# Patient Record
Sex: Female | Born: 1935 | Race: White | Hispanic: No | Smoking: Never smoker
Health system: Southern US, Community
[De-identification: ages and names within clinical notes are randomized; demographics above are authoritative.]

## PROBLEM LIST (undated history)

## (undated) DIAGNOSIS — R413 Other amnesia: Secondary | ICD-10-CM

## (undated) DIAGNOSIS — E559 Vitamin D deficiency, unspecified: Secondary | ICD-10-CM

## (undated) HISTORY — DX: Vitamin D deficiency, unspecified: E55.9

---

## 2014-01-17 ENCOUNTER — Other Ambulatory Visit: Payer: Self-pay | Admitting: Family Medicine

## 2014-01-17 DIAGNOSIS — N950 Postmenopausal bleeding: Secondary | ICD-10-CM

## 2014-01-30 ENCOUNTER — Other Ambulatory Visit: Payer: Self-pay

## 2014-02-12 ENCOUNTER — Other Ambulatory Visit: Payer: Self-pay | Admitting: Obstetrics and Gynecology

## 2014-02-12 ENCOUNTER — Other Ambulatory Visit (HOSPITAL_COMMUNITY)
Admission: RE | Admit: 2014-02-12 | Discharge: 2014-02-12 | Disposition: A | Discharge status: Home / Self Care | Payer: Medicare Other | Source: Ambulatory Visit | Attending: Obstetrics and Gynecology | Admitting: Obstetrics and Gynecology

## 2014-02-12 DIAGNOSIS — Z1151 Encounter for screening for human papillomavirus (HPV): Secondary | ICD-10-CM | POA: Insufficient documentation

## 2014-02-12 DIAGNOSIS — Z124 Encounter for screening for malignant neoplasm of cervix: Secondary | ICD-10-CM | POA: Insufficient documentation

## 2014-02-13 LAB — CYTOLOGY - PAP

## 2014-02-20 ENCOUNTER — Other Ambulatory Visit (HOSPITAL_COMMUNITY)
Admission: RE | Admit: 2014-02-20 | Discharge: 2014-02-20 | Disposition: A | Discharge status: Home / Self Care | Payer: Medicare Other | Source: Ambulatory Visit | Attending: Obstetrics and Gynecology | Admitting: Obstetrics and Gynecology

## 2014-02-20 ENCOUNTER — Other Ambulatory Visit: Payer: Self-pay | Admitting: Obstetrics and Gynecology

## 2014-02-20 DIAGNOSIS — Z124 Encounter for screening for malignant neoplasm of cervix: Secondary | ICD-10-CM | POA: Insufficient documentation

## 2014-02-21 LAB — CYTOLOGY - PAP

## 2015-05-06 ENCOUNTER — Ambulatory Visit
Admission: RE | Admit: 2015-05-06 | Discharge: 2015-05-06 | Disposition: A | Discharge status: Home / Self Care | Payer: Medicare Other | Source: Ambulatory Visit | Attending: Family Medicine | Admitting: Family Medicine

## 2015-05-06 ENCOUNTER — Other Ambulatory Visit: Payer: Self-pay | Admitting: Family Medicine

## 2015-05-06 DIAGNOSIS — W19XXXA Unspecified fall, initial encounter: Secondary | ICD-10-CM

## 2017-09-05 ENCOUNTER — Emergency Department (HOSPITAL_COMMUNITY): Payer: Medicare Other

## 2017-09-05 ENCOUNTER — Emergency Department (HOSPITAL_COMMUNITY)
Admission: EM | Admit: 2017-09-05 | Discharge: 2017-09-05 | Disposition: A | Discharge status: Home / Self Care | Payer: Medicare Other | Attending: Emergency Medicine | Admitting: Emergency Medicine

## 2017-09-05 ENCOUNTER — Encounter (HOSPITAL_COMMUNITY): Payer: Self-pay | Admitting: Emergency Medicine

## 2017-09-05 DIAGNOSIS — F0390 Unspecified dementia without behavioral disturbance: Secondary | ICD-10-CM | POA: Insufficient documentation

## 2017-09-05 DIAGNOSIS — R1110 Vomiting, unspecified: Secondary | ICD-10-CM | POA: Insufficient documentation

## 2017-09-05 DIAGNOSIS — R05 Cough: Secondary | ICD-10-CM | POA: Diagnosis present

## 2017-09-05 DIAGNOSIS — J189 Pneumonia, unspecified organism: Secondary | ICD-10-CM | POA: Diagnosis not present

## 2017-09-05 HISTORY — DX: Other amnesia: R41.3

## 2017-09-05 LAB — URINALYSIS, ROUTINE W REFLEX MICROSCOPIC
Bilirubin Urine: NEGATIVE
GLUCOSE, UA: NEGATIVE mg/dL
HGB URINE DIPSTICK: NEGATIVE
KETONES UR: NEGATIVE mg/dL
LEUKOCYTES UA: NEGATIVE
NITRITE: NEGATIVE
PH: 7 (ref 5.0–8.0)
PROTEIN: 30 mg/dL — AB
Specific Gravity, Urine: 1.012 (ref 1.005–1.030)

## 2017-09-05 LAB — COMPREHENSIVE METABOLIC PANEL
ALK PHOS: 115 U/L (ref 38–126)
ALT: 17 U/L (ref 14–54)
ANION GAP: 7 (ref 5–15)
AST: 24 U/L (ref 15–41)
Albumin: 3.8 g/dL (ref 3.5–5.0)
BUN: 17 mg/dL (ref 6–20)
CALCIUM: 9.4 mg/dL (ref 8.9–10.3)
CO2: 27 mmol/L (ref 22–32)
Chloride: 104 mmol/L (ref 101–111)
Creatinine, Ser: 0.77 mg/dL (ref 0.44–1.00)
Glucose, Bld: 168 mg/dL — ABNORMAL HIGH (ref 65–99)
Potassium: 3.9 mmol/L (ref 3.5–5.1)
SODIUM: 138 mmol/L (ref 135–145)
TOTAL PROTEIN: 8 g/dL (ref 6.5–8.1)
Total Bilirubin: 0.2 mg/dL — ABNORMAL LOW (ref 0.3–1.2)

## 2017-09-05 LAB — CBC
HCT: 37.5 % (ref 36.0–46.0)
HEMOGLOBIN: 11.9 g/dL — AB (ref 12.0–15.0)
MCH: 28.2 pg (ref 26.0–34.0)
MCHC: 31.7 g/dL (ref 30.0–36.0)
MCV: 88.9 fL (ref 78.0–100.0)
Platelets: 267 10*3/uL (ref 150–400)
RBC: 4.22 MIL/uL (ref 3.87–5.11)
RDW: 13.3 % (ref 11.5–15.5)
WBC: 8.3 10*3/uL (ref 4.0–10.5)

## 2017-09-05 LAB — LIPASE, BLOOD: Lipase: 36 U/L (ref 11–51)

## 2017-09-05 MED ORDER — AZITHROMYCIN 250 MG PO TABS: 250.0000 mg | ORAL_TABLET | Freq: Every day | ORAL | 0 refills | Status: DC

## 2017-09-05 MED ORDER — IOPAMIDOL (ISOVUE-300) INJECTION 61%
INTRAVENOUS | Status: AC
  Filled 2017-09-05: qty 75

## 2017-09-05 MED ORDER — DEXTROSE 5 % IV SOLN
1.0000 g | Freq: Once | INTRAVENOUS | Status: AC
  Administered 2017-09-05: 1 g via INTRAVENOUS
  Filled 2017-09-05: qty 10

## 2017-09-05 MED ORDER — AZITHROMYCIN 500 MG IV SOLR
500.0000 mg | Freq: Once | INTRAVENOUS | Status: AC
  Administered 2017-09-05: 500 mg via INTRAVENOUS
  Filled 2017-09-05: qty 500

## 2017-09-05 MED ORDER — ONDANSETRON HCL 4 MG/2ML IJ SOLN
4.0000 mg | Freq: Once | INTRAMUSCULAR | Status: AC
  Administered 2017-09-05: 4 mg via INTRAVENOUS
  Filled 2017-09-05: qty 2

## 2017-09-05 MED ORDER — SODIUM CHLORIDE 0.9 % IV BOLUS (SEPSIS)
500.0000 mL | Freq: Once | INTRAVENOUS | Status: AC
  Administered 2017-09-05: 500 mL via INTRAVENOUS

## 2017-09-05 MED ORDER — IOPAMIDOL (ISOVUE-300) INJECTION 61%
75.0000 mL | Freq: Once | INTRAVENOUS | Status: AC | PRN
  Administered 2017-09-05: 75 mL via INTRAVENOUS

## 2017-09-05 NOTE — Discharge Instructions (Signed)
See your Physician for recheck in 3-4 day.  You need a repeat a Ct scan of your chest in 4 weeks to evaluate for nodule

## 2017-09-05 NOTE — ED Triage Notes (Signed)
Patients son reports pt c/o nausea and "not feeling right." symptoms began 10am this morning. Pt presented covered in emesis in lap. Denies any abdominal pain.

## 2017-09-05 NOTE — ED Provider Notes (Signed)
[facility]-EMERGENCY DEPT Provider Note   CSN: [id number] Arrival date & time: 09/05/17  1219     History   Chief Complaint Chief Complaint  Patient presents with  . Emesis  . Hypertension    HPI Samantha Hickman is a 83 y.o. female.  The history is provided by a relative. No language interpreter was used.  Emesis   This is a new problem. The current episode started 1 to 2 hours ago. The problem occurs 2 to 4 times per day. The problem has not changed since onset.There has been no fever. Pertinent negatives include no abdominal pain, no chills and no fever.  Hypertension  Pertinent negatives include no abdominal pain.  Family reports pt has had a cough.  Pt recently started on aricept for dementia.  No other medications.  No history of hypertension.  Pt was a church an vomited today.  Family reports pt has had a recent increase in dementia symptoms.  Pt lives at home.    Past Medical History:  Diagnosis Date  . Memory impairment     There are no active problems to display for this patient.   History reviewed. No pertinent surgical history.  OB History    No data available       Home Medications    Prior to Admission medications   Medication Sig Start Date End Date Taking? Authorizing Provider  donepezil (ARICEPT) 10 MG tablet Take 10 mg by mouth at bedtime.   Yes [provider]  azithromycin (ZITHROMAX) 250 MG tablet Take 1 tablet (250 mg total) by mouth daily. Take first 2 tablets together, then 1 every day until finished. 09/05/17   Elson Areas, PA-C    Family History No family history on file.  Social History Social History   Tobacco Use  . Smoking status: Never Smoker  . Smokeless tobacco: Never Used  Substance Use Topics  . Alcohol use: No    Frequency: Never  . Drug use: Not on file     Allergies   Patient has no known allergies.   Review of Systems Review of Systems  Constitutional: Negative for chills and  fever.  Gastrointestinal: Positive for vomiting. Negative for abdominal pain.  All other systems reviewed and are negative.    Physical Exam Updated Vital Signs BP (!) 150/80 (BP Location: Right Arm)   Pulse 88   Temp (!) 97.4 F (36.3 C) (Oral)   Resp 16   SpO2 94%   Physical Exam  Constitutional: She is oriented to person, place, and time. She appears well-developed and well-nourished.  HENT:  Head: Normocephalic.  Right Ear: External ear normal.  Left Ear: External ear normal.  Eyes: Conjunctivae and EOM are normal. Pupils are equal, round, and reactive to light.  Neck: Normal range of motion.  Cardiovascular: Normal rate and regular rhythm.  Pulmonary/Chest: Effort normal.  Abdominal: She exhibits no distension.  Musculoskeletal: Normal range of motion.  Neurological: She is alert and oriented to person, place, and time.  Skin: Skin is warm.  Psychiatric: She has a normal mood and affect.  Nursing note and vitals reviewed.    ED Treatments / Results  Labs (all labs ordered are listed, but only abnormal results are displayed) Labs Reviewed  COMPREHENSIVE METABOLIC PANEL - Abnormal; Notable for the following components:      Result Value   Glucose, Bld 168 (*)    Total Bilirubin 0.2 (*)    All other components within normal limits  CBC - Abnormal; Notable for the following components:   Hemoglobin 11.9 (*)    All other components within normal limits  URINALYSIS, ROUTINE W REFLEX MICROSCOPIC - Abnormal; Notable for the following components:   Color, Urine STRAW (*)    Protein, ur 30 (*)    Bacteria, UA FEW (*)    Squamous Epithelial / LPF 0-5 (*)    All other components within normal limits  LIPASE, BLOOD    EKG  EKG Interpretation None       Radiology Dg Chest 2 View  Result Date: 09/05/2017 CLINICAL DATA:  Cough and congestion EXAM: CHEST  2 VIEW COMPARISON:  None. FINDINGS: Cardiac shadow is within normal limits. The lungs are well aerated  bilaterally. Nodular changes are seen bilaterally suspicious for metastatic disease. Diffuse interstitial changes are noted. No sizable effusion is noted. Bridging osteophytes are noted within the thoracic spine. IMPRESSION: Nodular appearing densities bilaterally suspicious for metastatic disease. CT of the chest would be helpful for further evaluation. Electronically Signed   By: Alcide CleverMark  Lukens M.D.   On: 09/05/2017 13:58   Ct Head Wo Contrast  Result Date: 09/05/2017 CLINICAL DATA:  Altered level of consciousness.  Nausea.  Vomiting. EXAM: CT HEAD WITHOUT CONTRAST TECHNIQUE: Contiguous axial images were obtained from the base of the skull through the vertex without intravenous contrast. COMPARISON:  None. FINDINGS: Brain: No evidence of parenchymal hemorrhage or extra-axial fluid collection. No mass lesion, mass effect, or midline shift. No CT evidence of acute infarction. Nonspecific mild to moderate subcortical and periventricular white matter hypodensity, most in keeping with chronic small vessel ischemic change. Generalized cerebral volume loss. Cerebral ventricle sizes are concordant with the degree of cerebral volume loss. Vascular: No acute abnormality. Skull: No evidence of calvarial fracture. Sinuses/Orbits: No fluid levels. Mucoperiosteal thickening in the maxillary sinuses, ethmoidal air cells, frontal sinus and sphenoid sinus. Other:  The mastoid air cells are unopacified. IMPRESSION: 1.  No evidence of acute intracranial abnormality. 2. Generalized cerebral volume loss and mild-to-moderate chronic small vessel ischemic changes in the cerebral white matter. 3. Mild paranasal sinusitis, chronic appearing. Electronically Signed   By: Delbert PhenixJason A Poff M.D.   On: 09/05/2017 14:24   Ct Chest W Contrast  Result Date: 09/05/2017 CLINICAL DATA:  Pulmonary nodule on chest radiograph. Clinical history of cough and congestion. No primary history malignancy. EXAM: CT CHEST WITH CONTRAST TECHNIQUE:  Multidetector CT imaging of the chest was performed during intravenous contrast administration. CONTRAST:  75mL ISOVUE-300 IOPAMIDOL (ISOVUE-300) INJECTION 61% COMPARISON:  Chest radiograph of earlier today. FINDINGS: Cardiovascular: Aortic and branch vessel atherosclerosis. Tortuous thoracic aorta. Normal heart size, without pericardial effusion. Lad coronary artery atherosclerosis. No central pulmonary embolism, on this non-dedicated study. Mediastinum/Nodes: No mediastinal or hilar adenopathy. Lungs/Pleura: No pleural fluid. A relatively diffuse pattern of peribronchovascular nodularity, mucoid impaction, and bronchiectasis. More focal volume loss and traction bronchiectasis within the right middle lobe and lingula. Concurrent larger pulmonary nodules, including within the right lower lobe at 1.7 cm on image 101/series 5 and the left lower lobe at 2.0 cm on image 107/series 5. Right middle lobe cavitary nodule, including at 1.5 cm on image 72/ series 5. Upper Abdomen: Normal imaged portions of the liver, spleen, stomach, right adrenal gland, left kidney. Mild left adrenal thickening. Incompletely imaged upper pole right renal 3.3 cm lesion is most consistent with a cyst or minimally complex cyst. Musculoskeletal: Osteopenia. Accentuation of expected thoracic kyphosis with diffuse idiopathic skeletal hyperostosis. IMPRESSION: 1. Pattern of diffuse pulmonary  nodularity, mucoid impaction, and bronchiectasis. 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 2. Concurrent larger nodules which are indeterminate. These also could be infectious. One or more neoplastic nodules (primary or metastasis) cannot be excluded. Consider antibiotic therapy and relatively short term CT follow-up at 3-4 weeks. 3. Coronary artery atherosclerosis. Aortic Atherosclerosis (ICD10-I70.0). Electronically Signed   By: Jeronimo GreavesKyle  Talbot M.D.   On: 09/05/2017 15:29    Procedures Procedures  (including critical care time)  Medications Ordered in ED Medications  iopamidol (ISOVUE-300) 61 % injection (not administered)  azithromycin (ZITHROMAX) 500 mg in dextrose 5 % 250 mL IVPB (500 mg Intravenous New Bag/Given 09/05/17 1600)  sodium chloride 0.9 % bolus 500 mL (0 mLs Intravenous Stopped 09/05/17 1435)  ondansetron (ZOFRAN) injection 4 mg (4 mg Intravenous Given 09/05/17 1406)  iopamidol (ISOVUE-300) 61 % injection 75 mL (75 mLs Intravenous Contrast Given 09/05/17 1507)  cefTRIAXone (ROCEPHIN) 1 g in dextrose 5 % 50 mL IVPB (0 g Intravenous Stopped 09/05/17 1655)     Initial Impression / Assessment and Plan / ED Course  I have reviewed the triage vital signs and the nursing notes.  Pertinent labs & imaging results that were available during my care of the patient were reviewed by me and considered in my medical decision making (see chart for details).   PORT score 3.  Pt looks good overall.  No wbc count.  Symptoms seem consistent with possible early pneumonia.  I will cover with Rocephin and zithromax.  Family advised pt will need to return f symptoms worsen or change.    I advised Radiologist advised repeat ct scan in 4 weeks for evaluation of pulmonary nodules.     Final Clinical Impressions(s) / ED Diagnoses   Final diagnoses:  Community acquired pneumonia, unspecified laterality    ED Discharge Orders        Ordered    azithromycin (ZITHROMAX) 250 MG tablet  Daily     09/05/17 1643     Current Meds  Medication Sig  . donepezil (ARICEPT) 10 MG tablet Take 10 mg by mouth at bedtime.  An After Visit Summary was printed and given to the patient.     Elson AreasSofia, Sheli Dorin K, [place]PA-C 09/05/17 1706    Marily MemosMesner, Jason, MD 09/06/17 [phone]

## 2017-10-07 ENCOUNTER — Other Ambulatory Visit: Payer: Self-pay | Admitting: Nurse Practitioner

## 2017-10-07 DIAGNOSIS — R918 Other nonspecific abnormal finding of lung field: Secondary | ICD-10-CM

## 2017-10-07 DIAGNOSIS — J189 Pneumonia, unspecified organism: Secondary | ICD-10-CM

## 2017-10-13 ENCOUNTER — Ambulatory Visit
Admission: RE | Admit: 2017-10-13 | Discharge: 2017-10-13 | Disposition: A | Discharge status: Home / Self Care | Payer: Medicare Other | Source: Ambulatory Visit | Attending: Nurse Practitioner | Admitting: Nurse Practitioner

## 2017-10-13 DIAGNOSIS — J189 Pneumonia, unspecified organism: Secondary | ICD-10-CM

## 2017-10-13 DIAGNOSIS — R918 Other nonspecific abnormal finding of lung field: Secondary | ICD-10-CM

## 2017-10-13 MED ORDER — IOPAMIDOL (ISOVUE-300) INJECTION 61%
75.0000 mL | Freq: Once | INTRAVENOUS | Status: AC | PRN
  Administered 2017-10-13: 75 mL via INTRAVENOUS

## 2020-07-03 ENCOUNTER — Emergency Department (HOSPITAL_COMMUNITY): Payer: Medicare Other

## 2020-07-03 ENCOUNTER — Other Ambulatory Visit: Payer: Self-pay

## 2020-07-03 ENCOUNTER — Inpatient Hospital Stay (HOSPITAL_COMMUNITY)
Admission: EM | Admit: 2020-07-03 | Discharge: 2020-07-08 | DRG: 481 | Disposition: A | Discharge status: Skilled Nursing Facility | Payer: Medicare Other | Attending: Internal Medicine | Admitting: Internal Medicine

## 2020-07-03 ENCOUNTER — Emergency Department (HOSPITAL_COMMUNITY): Payer: Medicare Other | Admitting: Anesthesiology

## 2020-07-03 ENCOUNTER — Encounter (HOSPITAL_COMMUNITY)
Admission: EM | Disposition: A | Discharge status: Skilled Nursing Facility | Payer: Self-pay | Source: Home / Self Care | Attending: Internal Medicine

## 2020-07-03 ENCOUNTER — Inpatient Hospital Stay (HOSPITAL_COMMUNITY): Payer: Medicare Other

## 2020-07-03 ENCOUNTER — Encounter (HOSPITAL_COMMUNITY): Payer: Self-pay | Admitting: Emergency Medicine

## 2020-07-03 DIAGNOSIS — Z66 Do not resuscitate: Secondary | ICD-10-CM | POA: Diagnosis present

## 2020-07-03 DIAGNOSIS — D62 Acute posthemorrhagic anemia: Secondary | ICD-10-CM | POA: Diagnosis not present

## 2020-07-03 DIAGNOSIS — S72142A Displaced intertrochanteric fracture of left femur, initial encounter for closed fracture: Principal | ICD-10-CM | POA: Diagnosis present

## 2020-07-03 DIAGNOSIS — W1830XA Fall on same level, unspecified, initial encounter: Secondary | ICD-10-CM | POA: Diagnosis present

## 2020-07-03 DIAGNOSIS — Z20822 Contact with and (suspected) exposure to covid-19: Secondary | ICD-10-CM | POA: Diagnosis present

## 2020-07-03 DIAGNOSIS — Z419 Encounter for procedure for purposes other than remedying health state, unspecified: Secondary | ICD-10-CM

## 2020-07-03 DIAGNOSIS — A310 Pulmonary mycobacterial infection: Secondary | ICD-10-CM | POA: Diagnosis present

## 2020-07-03 DIAGNOSIS — Z79899 Other long term (current) drug therapy: Secondary | ICD-10-CM

## 2020-07-03 DIAGNOSIS — Y92009 Unspecified place in unspecified non-institutional (private) residence as the place of occurrence of the external cause: Secondary | ICD-10-CM

## 2020-07-03 DIAGNOSIS — F0390 Unspecified dementia without behavioral disturbance: Secondary | ICD-10-CM | POA: Diagnosis present

## 2020-07-03 DIAGNOSIS — W19XXXA Unspecified fall, initial encounter: Secondary | ICD-10-CM

## 2020-07-03 DIAGNOSIS — S72002A Fracture of unspecified part of neck of left femur, initial encounter for closed fracture: Secondary | ICD-10-CM

## 2020-07-03 DIAGNOSIS — E559 Vitamin D deficiency, unspecified: Secondary | ICD-10-CM | POA: Diagnosis present

## 2020-07-03 DIAGNOSIS — R600 Localized edema: Secondary | ICD-10-CM | POA: Diagnosis present

## 2020-07-03 DIAGNOSIS — T148XXA Other injury of unspecified body region, initial encounter: Secondary | ICD-10-CM

## 2020-07-03 HISTORY — PX: INTRAMEDULLARY (IM) NAIL INTERTROCHANTERIC: SHX5875

## 2020-07-03 HISTORY — PX: ORIF FEMUR FRACTURE: SHX2119

## 2020-07-03 LAB — CBC WITH DIFFERENTIAL/PLATELET
Abs Immature Granulocytes: 0.03 10*3/uL (ref 0.00–0.07)
Basophils Absolute: 0.1 10*3/uL (ref 0.0–0.1)
Basophils Relative: 1 %
Eosinophils Absolute: 0.2 10*3/uL (ref 0.0–0.5)
Eosinophils Relative: 2 %
HCT: 42.2 % (ref 36.0–46.0)
Hemoglobin: 12.7 g/dL (ref 12.0–15.0)
Immature Granulocytes: 0 %
Lymphocytes Relative: 12 %
Lymphs Abs: 1 10*3/uL (ref 0.7–4.0)
MCH: 27.4 pg (ref 26.0–34.0)
MCHC: 30.1 g/dL (ref 30.0–36.0)
MCV: 90.9 fL (ref 80.0–100.0)
Monocytes Absolute: 0.4 10*3/uL (ref 0.1–1.0)
Monocytes Relative: 4 %
Neutro Abs: 7.1 10*3/uL (ref 1.7–7.7)
Neutrophils Relative %: 81 %
Platelets: 234 10*3/uL (ref 150–400)
RBC: 4.64 MIL/uL (ref 3.87–5.11)
RDW: 13.2 % (ref 11.5–15.5)
WBC: 8.7 10*3/uL (ref 4.0–10.5)
nRBC: 0 % (ref 0.0–0.2)

## 2020-07-03 LAB — BASIC METABOLIC PANEL
Anion gap: 7 (ref 5–15)
BUN: 14 mg/dL (ref 8–23)
CO2: 27 mmol/L (ref 22–32)
Calcium: 9 mg/dL (ref 8.9–10.3)
Chloride: 103 mmol/L (ref 98–111)
Creatinine, Ser: 0.86 mg/dL (ref 0.44–1.00)
GFR, Estimated: >60 mL/min (ref >60)
Glucose, Bld: 141 mg/dL — ABNORMAL HIGH (ref 70–99)
Potassium: 4.4 mmol/L (ref 3.5–5.1)
Sodium: 137 mmol/L (ref 135–145)

## 2020-07-03 LAB — RESPIRATORY PANEL BY RT PCR (FLU A&B, COVID)
Influenza A by PCR: NEGATIVE
Influenza B by PCR: NEGATIVE
SARS Coronavirus 2 by RT PCR: NEGATIVE

## 2020-07-03 SURGERY — FIXATION, FRACTURE, INTERTROCHANTERIC, WITH INTRAMEDULLARY ROD
Anesthesia: Monitor Anesthesia Care | Laterality: Left

## 2020-07-03 MED ORDER — CEFAZOLIN SODIUM-DEXTROSE 2-4 GM/100ML-% IV SOLN
2.0000 g | Freq: Three times a day (TID) | INTRAVENOUS | Status: AC
  Administered 2020-07-04 (×3): 2 g via INTRAVENOUS
  Filled 2020-07-03 (×3): qty 100

## 2020-07-03 MED ORDER — FENTANYL CITRATE (PF) 100 MCG/2ML IJ SOLN: 25.0000 ug | INTRAMUSCULAR | Status: DC | PRN

## 2020-07-03 MED ORDER — ONDANSETRON HCL 4 MG PO TABS: 4.0000 mg | ORAL_TABLET | Freq: Four times a day (QID) | ORAL | Status: DC | PRN

## 2020-07-03 MED ORDER — DEXMEDETOMIDINE HCL 200 MCG/2ML IV SOLN
INTRAVENOUS | Status: DC | PRN
  Administered 2020-07-03: 8 ug via INTRAVENOUS

## 2020-07-03 MED ORDER — ONDANSETRON HCL 4 MG/2ML IJ SOLN
INTRAMUSCULAR | Status: DC | PRN
  Administered 2020-07-03: 4 mg via INTRAVENOUS

## 2020-07-03 MED ORDER — MIDAZOLAM HCL 2 MG/2ML IJ SOLN
INTRAMUSCULAR | Status: AC
  Filled 2020-07-03: qty 2

## 2020-07-03 MED ORDER — CHLORHEXIDINE GLUCONATE 4 % EX LIQD
60.0000 mL | Freq: Once | CUTANEOUS | Status: DC
  Filled 2020-07-03: qty 60

## 2020-07-03 MED ORDER — FENTANYL CITRATE (PF) 250 MCG/5ML IJ SOLN
INTRAMUSCULAR | Status: AC
  Filled 2020-07-03: qty 5

## 2020-07-03 MED ORDER — ENOXAPARIN SODIUM 40 MG/0.4ML ~~LOC~~ SOLN
40.0000 mg | SUBCUTANEOUS | Status: DC
  Administered 2020-07-04 – 2020-07-08 (×5): 40 mg via SUBCUTANEOUS
  Filled 2020-07-03 (×5): qty 0.4

## 2020-07-03 MED ORDER — METHOCARBAMOL 500 MG PO TABS
500.0000 mg | ORAL_TABLET | Freq: Four times a day (QID) | ORAL | Status: DC | PRN
  Administered 2020-07-04: 500 mg via ORAL
  Filled 2020-07-03 (×2): qty 1

## 2020-07-03 MED ORDER — VANCOMYCIN HCL 1000 MG IV SOLR
INTRAVENOUS | Status: AC
  Filled 2020-07-03: qty 1000

## 2020-07-03 MED ORDER — LACTATED RINGERS IV SOLN: INTRAVENOUS | Status: DC

## 2020-07-03 MED ORDER — ORAL CARE MOUTH RINSE: 15.0000 mL | Freq: Once | OROMUCOSAL | Status: AC

## 2020-07-03 MED ORDER — PHENYLEPHRINE HCL-NACL 10-0.9 MG/250ML-% IV SOLN
INTRAVENOUS | Status: DC | PRN
  Administered 2020-07-03: 50 ug/min via INTRAVENOUS

## 2020-07-03 MED ORDER — ACETAMINOPHEN 325 MG PO TABS
325.0000 mg | ORAL_TABLET | Freq: Four times a day (QID) | ORAL | Status: DC | PRN
  Administered 2020-07-08: 650 mg via ORAL
  Filled 2020-07-03: qty 2

## 2020-07-03 MED ORDER — POTASSIUM CHLORIDE IN NACL 20-0.9 MEQ/L-% IV SOLN
INTRAVENOUS | Status: DC
  Filled 2020-07-03 (×2): qty 1000

## 2020-07-03 MED ORDER — MORPHINE SULFATE (PF) 2 MG/ML IV SOLN: 0.5000 mg | INTRAVENOUS | Status: DC | PRN

## 2020-07-03 MED ORDER — FENTANYL CITRATE (PF) 100 MCG/2ML IJ SOLN
INTRAMUSCULAR | Status: DC | PRN
  Administered 2020-07-03 (×2): 25 ug via INTRAVENOUS

## 2020-07-03 MED ORDER — PHENYLEPHRINE 40 MCG/ML (10ML) SYRINGE FOR IV PUSH (FOR BLOOD PRESSURE SUPPORT)
PREFILLED_SYRINGE | INTRAVENOUS | Status: AC
  Filled 2020-07-03: qty 10

## 2020-07-03 MED ORDER — HYDROCODONE-ACETAMINOPHEN 5-325 MG PO TABS
1.0000 | ORAL_TABLET | ORAL | Status: DC | PRN
  Administered 2020-07-04 (×2): 1 via ORAL
  Filled 2020-07-03 (×2): qty 1

## 2020-07-03 MED ORDER — DOCUSATE SODIUM 100 MG PO CAPS
100.0000 mg | ORAL_CAPSULE | Freq: Two times a day (BID) | ORAL | Status: DC
  Administered 2020-07-03 – 2020-07-08 (×9): 100 mg via ORAL
  Filled 2020-07-03 (×11): qty 1

## 2020-07-03 MED ORDER — PROPOFOL 10 MG/ML IV BOLUS
INTRAVENOUS | Status: DC | PRN
  Administered 2020-07-03 (×2): 10 mg via INTRAVENOUS
  Administered 2020-07-03: 5 mg via INTRAVENOUS

## 2020-07-03 MED ORDER — ONDANSETRON HCL 4 MG/2ML IJ SOLN: 4.0000 mg | Freq: Once | INTRAMUSCULAR | Status: DC | PRN

## 2020-07-03 MED ORDER — METOCLOPRAMIDE HCL 5 MG/ML IJ SOLN: 5.0000 mg | Freq: Three times a day (TID) | INTRAMUSCULAR | Status: DC | PRN

## 2020-07-03 MED ORDER — 0.9 % SODIUM CHLORIDE (POUR BTL) OPTIME
TOPICAL | Status: DC | PRN
  Administered 2020-07-03: 1000 mL

## 2020-07-03 MED ORDER — ONDANSETRON HCL 4 MG/2ML IJ SOLN
INTRAMUSCULAR | Status: AC
  Filled 2020-07-03: qty 2

## 2020-07-03 MED ORDER — VANCOMYCIN HCL 1000 MG IV SOLR
INTRAVENOUS | Status: DC | PRN
  Administered 2020-07-03: 1000 mg via TOPICAL

## 2020-07-03 MED ORDER — CHLORHEXIDINE GLUCONATE 0.12 % MT SOLN
15.0000 mL | Freq: Once | OROMUCOSAL | Status: AC
  Filled 2020-07-03: qty 15

## 2020-07-03 MED ORDER — METOCLOPRAMIDE HCL 5 MG PO TABS: 5.0000 mg | ORAL_TABLET | Freq: Three times a day (TID) | ORAL | Status: DC | PRN

## 2020-07-03 MED ORDER — POLYETHYLENE GLYCOL 3350 17 G PO PACK: 17.0000 g | PACK | Freq: Every day | ORAL | Status: DC | PRN

## 2020-07-03 MED ORDER — ONDANSETRON HCL 4 MG/2ML IJ SOLN: 4.0000 mg | Freq: Four times a day (QID) | INTRAMUSCULAR | Status: DC | PRN

## 2020-07-03 MED ORDER — PROPOFOL 500 MG/50ML IV EMUL
INTRAVENOUS | Status: DC | PRN
  Administered 2020-07-03: 20 mg via INTRAVENOUS
  Administered 2020-07-03: 50 mg via INTRAVENOUS
  Administered 2020-07-03: 5 mg via INTRAVENOUS
  Administered 2020-07-03: 25 ug/kg/min via INTRAVENOUS

## 2020-07-03 MED ORDER — CHLORHEXIDINE GLUCONATE 0.12 % MT SOLN
OROMUCOSAL | Status: AC
  Administered 2020-07-03: 15 mL via OROMUCOSAL
  Filled 2020-07-03: qty 15

## 2020-07-03 MED ORDER — METHOCARBAMOL 1000 MG/10ML IJ SOLN
500.0000 mg | Freq: Four times a day (QID) | INTRAVENOUS | Status: DC | PRN
  Filled 2020-07-03: qty 5

## 2020-07-03 MED ORDER — PHENYLEPHRINE 40 MCG/ML (10ML) SYRINGE FOR IV PUSH (FOR BLOOD PRESSURE SUPPORT)
PREFILLED_SYRINGE | INTRAVENOUS | Status: DC | PRN
  Administered 2020-07-03: 80 ug via INTRAVENOUS
  Administered 2020-07-03: 120 ug via INTRAVENOUS

## 2020-07-03 MED ORDER — FUROSEMIDE 20 MG PO TABS
20.0000 mg | ORAL_TABLET | Freq: Every day | ORAL | Status: DC
  Administered 2020-07-03 – 2020-07-08 (×6): 20 mg via ORAL
  Filled 2020-07-03 (×6): qty 1

## 2020-07-03 MED ORDER — DEXAMETHASONE SODIUM PHOSPHATE 10 MG/ML IJ SOLN
INTRAMUSCULAR | Status: AC
  Filled 2020-07-03: qty 1

## 2020-07-03 MED ORDER — POVIDONE-IODINE 10 % EX SWAB: 2.0000 "application " | Freq: Once | CUTANEOUS | Status: DC

## 2020-07-03 MED ORDER — DEXAMETHASONE SODIUM PHOSPHATE 4 MG/ML IJ SOLN
INTRAMUSCULAR | Status: DC | PRN
  Administered 2020-07-03: 4 mg via INTRAVENOUS

## 2020-07-03 MED ORDER — CEFAZOLIN SODIUM-DEXTROSE 2-4 GM/100ML-% IV SOLN
2.0000 g | INTRAVENOUS | Status: AC
  Administered 2020-07-03: 2 g via INTRAVENOUS
  Filled 2020-07-03: qty 100

## 2020-07-03 SURGICAL SUPPLY — 43 items
APL PRP STRL LF DISP 70% ISPRP (MISCELLANEOUS) ×1
BIT DRILL INTERTAN LAG SCREW (BIT) ×3 IMPLANT
BIT DRILL LONG 4.0 (BIT) ×1 IMPLANT
BRUSH SCRUB EZ PLAIN DRY (MISCELLANEOUS) ×6 IMPLANT
CHLORAPREP W/TINT 26 (MISCELLANEOUS) ×3 IMPLANT
COVER PERINEAL POST (MISCELLANEOUS) ×3 IMPLANT
COVER SURGICAL LIGHT HANDLE (MISCELLANEOUS) ×3 IMPLANT
DRAPE C-ARM 35X43 STRL (DRAPES) ×3 IMPLANT
DRAPE IMP U-DRAPE 54X76 (DRAPES) ×6 IMPLANT
DRAPE INCISE IOBAN 66X45 STRL (DRAPES) ×3 IMPLANT
DRAPE STERI IOBAN 125X83 (DRAPES) ×3 IMPLANT
DRAPE SURG 17X23 STRL (DRAPES) ×6 IMPLANT
DRAPE U-SHAPE 47X51 STRL (DRAPES) ×3 IMPLANT
DRILL BIT LONG 4.0 (BIT) ×3
DRSG MEPILEX BORDER 4X4 (GAUZE/BANDAGES/DRESSINGS) ×3 IMPLANT
ELECT REM PT RETURN 9FT ADLT (ELECTROSURGICAL) ×3
ELECTRODE REM PT RTRN 9FT ADLT (ELECTROSURGICAL) ×1 IMPLANT
GLOVE BIO SURGEON STRL SZ 6.5 (GLOVE) ×6 IMPLANT
GLOVE BIO SURGEON STRL SZ7.5 (GLOVE) ×12 IMPLANT
GLOVE BIO SURGEONS STRL SZ 6.5 (GLOVE) ×3
GLOVE BIOGEL PI IND STRL 6.5 (GLOVE) ×1 IMPLANT
GLOVE BIOGEL PI IND STRL 7.5 (GLOVE) ×1 IMPLANT
GLOVE BIOGEL PI INDICATOR 6.5 (GLOVE) ×2
GLOVE BIOGEL PI INDICATOR 7.5 (GLOVE) ×2
GOWN STRL REUS W/ TWL LRG LVL3 (GOWN DISPOSABLE) ×1 IMPLANT
GOWN STRL REUS W/TWL LRG LVL3 (GOWN DISPOSABLE) ×3
GUIDE PIN 3.2X343 (PIN) ×2
GUIDE PIN 3.2X343MM (PIN) ×6
KIT BASIN OR (CUSTOM PROCEDURE TRAY) ×3 IMPLANT
KIT TURNOVER KIT B (KITS) ×3 IMPLANT
MANIFOLD NEPTUNE II (INSTRUMENTS) ×3 IMPLANT
NAIL INTERTAN 10X18 130D 10S (Nail) ×3 IMPLANT
NS IRRIG 1000ML POUR BTL (IV SOLUTION) ×3 IMPLANT
PACK GENERAL/GYN (CUSTOM PROCEDURE TRAY) ×3 IMPLANT
PIN GUIDE 3.2X343MM (PIN) ×2 IMPLANT
SCREW LAG COMPR KIT 95/90 (Screw) ×3 IMPLANT
SCREW TRIGEN LOW PROF 5.0X35 (Screw) ×3 IMPLANT
SUT MNCRL AB 3-0 PS2 18 (SUTURE) ×3 IMPLANT
SUT VIC AB 0 CT1 27 (SUTURE) ×3
SUT VIC AB 0 CT1 27XBRD ANBCTR (SUTURE) ×1 IMPLANT
SUT VIC AB 2-0 CT1 27 (SUTURE) ×6
SUT VIC AB 2-0 CT1 TAPERPNT 27 (SUTURE) ×2 IMPLANT
TOWEL GREEN STERILE (TOWEL DISPOSABLE) ×6 IMPLANT

## 2020-07-03 NOTE — Anesthesia Preprocedure Evaluation (Addendum)
Anesthesia Evaluation  Patient identified by MRN, date of birth, ID band Patient awake    Reviewed: Allergy & Precautions, NPO status , Patient's Chart, lab work & pertinent test results  Airway Mallampati: II  TM Distance: >3 FB Neck ROM: Full    Dental no notable dental hx. (+) Dental Advisory Given   Pulmonary neg pulmonary ROS,    Pulmonary exam normal breath sounds clear to auscultation       Cardiovascular negative cardio ROS Normal cardiovascular exam Rhythm:Regular Rate:Normal     Neuro/Psych PSYCHIATRIC DISORDERS Dementia negative neurological ROS     GI/Hepatic negative GI ROS, Neg liver ROS,   Endo/Other  negative endocrine ROS  Renal/GU negative Renal ROS  negative genitourinary   Musculoskeletal L intertrochanteric hip fx   Abdominal   Peds negative pediatric ROS (+)  Hematology negative hematology ROS (+) hct 42.2, plt 234   Anesthesia Other Findings   Reproductive/Obstetrics negative OB ROS                            Anesthesia Physical Anesthesia Plan  ASA: III  Anesthesia Plan: Spinal and MAC   Post-op Pain Management:    Induction:   PONV Risk Score and Plan: 2 and Propofol infusion and TIVA  Airway Management Planned: Natural Airway and Nasal Cannula  Additional Equipment: None  Intra-op Plan:   Post-operative Plan:   Informed Consent: I have reviewed the patients History and Physical, chart, labs and discussed the procedure including the risks, benefits and alternatives for the proposed anesthesia with the patient or authorized representative who has indicated his/her understanding and acceptance.       Plan Discussed with: CRNA  Anesthesia Plan Comments:         Anesthesia Quick Evaluation

## 2020-07-03 NOTE — H&P (View-Only) (Signed)
Reason for Consult:Left hip fx Referring Physician: D [name] is an 86 y.o. female.  HPI: Samantha Hickman was at home with a care person when she suffered an unwitnessed fall in the bathroom. She had left hip pain and could not get up. She was brought to the ED where x-rays showed a hip fx and orthopedic surgery was consulted. She has a RW but refuses to use it to ambulate. She lives at home with help.  Past Medical History:  Diagnosis Date  . Memory impairment     History reviewed. No pertinent surgical history.  History reviewed. No pertinent family history.  Social History:  reports that she has never smoked. She has never used smokeless tobacco. She reports that she does not drink alcohol. No history on file for drug use.  Allergies: No Known Allergies  Medications: I have reviewed the patient's current medications.  Results for orders placed or performed during the hospital encounter of 07/03/20 (from the past 48 hour(s))  CBC with Differential     Status: None   Collection Time: 07/03/20 11:59 AM  Result Value Ref Range   WBC 8.7 4.0 - 10.5 K/uL   RBC 4.64 3.87 - 5.11 MIL/uL   Hemoglobin 12.7 12.0 - 15.0 g/dL   HCT 56.3 36 - 46 %   MCV 90.9 80.0 - 100.0 fL   MCH 27.4 26.0 - 34.0 pg   MCHC 30.1 30.0 - 36.0 g/dL   RDW 14.9 70.2 - 63.7 %   Platelets 234 150 - 400 K/uL   nRBC 0.0 0.0 - 0.2 %   Neutrophils Relative % 81 %   Neutro Abs 7.1 1.7 - 7.7 K/uL   Lymphocytes Relative 12 %   Lymphs Abs 1.0 0.7 - 4.0 K/uL   Monocytes Relative 4 %   Monocytes Absolute 0.4 0.1 - 1.0 K/uL   Eosinophils Relative 2 %   Eosinophils Absolute 0.2 0.0 - 0.5 K/uL   Basophils Relative 1 %   Basophils Absolute 0.1 0.0 - 0.1 K/uL   Immature Granulocytes 0 %   Abs Immature Granulocytes 0.03 0.00 - 0.07 K/uL    Comment: Performed at [facility], [address] [place], [place] [address]  Basic metabolic panel     Status: Abnormal   Collection Time: 07/03/20 11:59 AM  Result Value  Ref Range   Sodium 137 135 - 145 mmol/L   Potassium 4.4 3.5 - 5.1 mmol/L   Chloride 103 98 - 111 mmol/L   CO2 27 22 - 32 mmol/L   Glucose, Bld 141 (H) 70 - 99 mg/dL    Comment: Glucose reference range applies only to samples taken after fasting for at least 8 hours.   BUN 14 8 - 23 mg/dL   Creatinine, Ser 0.27 0.44 - 1.00 mg/dL   Calcium 9.0 8.9 - 74.1 mg/dL   GFR, Estimated >28 >78 mL/min    Comment: (NOTE) Calculated using the CKD-EPI Creatinine Equation (2021)    Anion gap 7 5 - 15    Comment: Performed at [facility], [address] [place], [place] [address]    DG Chest 1 View  Result Date: 07/03/2020 CLINICAL DATA:  Pain following fall EXAM: CHEST  1 VIEW COMPARISON:  September 05, 2017 chest radiograph; chest CT October 13, 2017 FINDINGS: There is diffuse interstitial thickening with scattered subcentimeter nodular opacities, also present on prior CT examination. Underlying areas of bronchiectatic change noted in the lower lobe regions. No edema  or airspace opacity. Heart is upper normal in size with pulmonary vascularity normal. There is aortic atherosclerosis. Bones are osteoporotic. IMPRESSION: Interstitial thickening with scattered subcentimeter nodular lesions, also seen previously. Underlying bronchiectatic change. There is likely a degree of chronic bronchitis. The appearance on previous CT is felt to be indicative of atypical infectious lesion such as Mycobacterium avium intracellulare. No frank edema or consolidation. Stable cardiac silhouette. Aortic Atherosclerosis (ICD10-I70.0). Electronically Signed   By: Bretta Bang III M.D.   On: 07/03/2020 12:53   DG Hip Unilat W or Wo Pelvis 2-3 Views Left  Result Date: 07/03/2020 CLINICAL DATA:  Left hip pain EXAM: DG HIP (WITH OR WITHOUT PELVIS) 2-3V LEFT COMPARISON:  None. FINDINGS: Diffuse osseous demineralization. Acute intertrochanteric fracture of the proximal left femur with slight varus angulation. There is a  rounded 1.9 cm nonspecific sclerotic area within the intertrochanteric aspect of the femur. No additional sclerotic or lytic bony abnormalities are seen within the left femur or visualized pelvis. IMPRESSION: 1. Acute intertrochanteric fracture of the proximal left femur with slight varus angulation. 2. Nonspecific 1.9 cm sclerotic area within the intertrochanteric aspect of the left femur. Although this appearance could be posttraumatic, an underlying bone lesion is not excluded. Correlation with prior outside imaging, if available, which includes the proximal femur is recommended. Electronically Signed   By: Duanne Guess D.O.   On: 07/03/2020 12:53    Review of Systems  HENT: Negative for ear discharge, ear pain, hearing loss and tinnitus.   Eyes: Negative for photophobia and pain.  Respiratory: Negative for cough and shortness of breath.   Cardiovascular: Negative for chest pain.  Gastrointestinal: Negative for abdominal pain, nausea and vomiting.  Genitourinary: Negative for dysuria, flank pain, frequency and urgency.  Musculoskeletal: Positive for arthralgias (Left hip). Negative for back pain, myalgias and neck pain.  Neurological: Negative for dizziness and headaches.  Hematological: Does not bruise/bleed easily.  Psychiatric/Behavioral: The patient is not nervous/anxious.    Blood pressure (!) 199/87, pulse 85, temperature 97.6 F (36.4 C), temperature source Oral, resp. rate 20, SpO2 96 %. Physical Exam Constitutional:      General: She is not in acute distress.    Appearance: She is well-developed. She is not diaphoretic.  HENT:     Head: Normocephalic and atraumatic.  Eyes:     General: No scleral icterus.       Right eye: No discharge.        Left eye: No discharge.     Conjunctiva/sclera: Conjunctivae normal.  Cardiovascular:     Rate and Rhythm: Normal rate and regular rhythm.  Pulmonary:     Effort: Pulmonary effort is normal. No respiratory distress.   Musculoskeletal:     Cervical back: Normal range of motion.     Comments: LLE No traumatic wounds, ecchymosis, or rash  Mild TTP hip  No knee or ankle effusion  Knee stable to varus/ valgus and anterior/posterior stress  Sens DPN, SPN, TN intact  Motor EHL, ext, flex, evers 5/5  DP 2+, PT 0, 3+ edema  Skin:    General: Skin is warm and dry.  Neurological:     Mental Status: She is alert.  Psychiatric:        Behavior: Behavior normal.     Assessment/Plan: Left hip fx -- Plan IMN by Dr. Jena Gauss today. Please keep NPO. BLE edema -- Home meds Dementia    Freeman Caldron, PA-C Orthopedic Surgery [phone] 07/03/2020, 1:11 PM

## 2020-07-03 NOTE — Consult Note (Signed)
Reason for Consult:Left hip fx Referring Physician: D Floyd  Samantha Hickman is an 86 y.o. female.  HPI: Samantha Hickman was at home with a care person when she suffered an unwitnessed fall in the bathroom. She had left hip pain and could not get up. She was brought to the ED where x-rays showed a hip fx and orthopedic surgery was consulted. She has a RW but refuses to use it to ambulate. She lives at home with help.  Past Medical History:  Diagnosis Date  . Memory impairment     History reviewed. No pertinent surgical history.  History reviewed. No pertinent family history.  Social History:  reports that she has never smoked. She has never used smokeless tobacco. She reports that she does not drink alcohol. No history on file for drug use.  Allergies: No Known Allergies  Medications: I have reviewed the patient's current medications.  Results for orders placed or performed during the hospital encounter of 07/03/20 (from the past 48 hour(s))  CBC with Differential     Status: None   Collection Time: 07/03/20 11:59 AM  Result Value Ref Range   WBC 8.7 4.0 - 10.5 K/uL   RBC 4.64 3.87 - 5.11 MIL/uL   Hemoglobin 12.7 12.0 - 15.0 g/dL   HCT 42.2 36 - 46 %   MCV 90.9 80.0 - 100.0 fL   MCH 27.4 26.0 - 34.0 pg   MCHC 30.1 30.0 - 36.0 g/dL   RDW 13.2 11.5 - 15.5 %   Platelets 234 150 - 400 K/uL   nRBC 0.0 0.0 - 0.2 %   Neutrophils Relative % 81 %   Neutro Abs 7.1 1.7 - 7.7 K/uL   Lymphocytes Relative 12 %   Lymphs Abs 1.0 0.7 - 4.0 K/uL   Monocytes Relative 4 %   Monocytes Absolute 0.4 0.1 - 1.0 K/uL   Eosinophils Relative 2 %   Eosinophils Absolute 0.2 0.0 - 0.5 K/uL   Basophils Relative 1 %   Basophils Absolute 0.1 0.0 - 0.1 K/uL   Immature Granulocytes 0 %   Abs Immature Granulocytes 0.03 0.00 - 0.07 K/uL    Comment: Performed at [facility], [address] [place], [place] [address]  Basic metabolic panel     Status: Abnormal   Collection Time: 07/03/20 11:59 AM  Result Value  Ref Range   Sodium 137 135 - 145 mmol/L   Potassium 4.4 3.5 - 5.1 mmol/L   Chloride 103 98 - 111 mmol/L   CO2 27 22 - 32 mmol/L   Glucose, Bld 141 (H) 70 - 99 mg/dL    Comment: Glucose reference range applies only to samples taken after fasting for at least 8 hours.   BUN 14 8 - 23 mg/dL   Creatinine, Ser 0.86 0.44 - 1.00 mg/dL   Calcium 9.0 8.9 - 10.3 mg/dL   GFR, Estimated >60 >60 mL/min    Comment: (NOTE) Calculated using the CKD-EPI Creatinine Equation (2021)    Anion gap 7 5 - 15    Comment: Performed at [facility], [address] [place], [place] [address]    DG Chest 1 View  Result Date: 07/03/2020 CLINICAL DATA:  Pain following fall EXAM: CHEST  1 VIEW COMPARISON:  September 05, 2017 chest radiograph; chest CT October 13, 2017 FINDINGS: There is diffuse interstitial thickening with scattered subcentimeter nodular opacities, also present on prior CT examination. Underlying areas of bronchiectatic change noted in the lower lobe regions. No edema   or airspace opacity. Heart is upper normal in size with pulmonary vascularity normal. There is aortic atherosclerosis. Bones are osteoporotic. IMPRESSION: Interstitial thickening with scattered subcentimeter nodular lesions, also seen previously. Underlying bronchiectatic change. There is likely a degree of chronic bronchitis. The appearance on previous CT is felt to be indicative of atypical infectious lesion such as Mycobacterium avium intracellulare. No frank edema or consolidation. Stable cardiac silhouette. Aortic Atherosclerosis (ICD10-I70.0). Electronically Signed   By: Bretta Bang III M.D.   On: 07/03/2020 12:53   DG Hip Unilat W or Wo Pelvis 2-3 Views Left  Result Date: 07/03/2020 CLINICAL DATA:  Left hip pain EXAM: DG HIP (WITH OR WITHOUT PELVIS) 2-3V LEFT COMPARISON:  None. FINDINGS: Diffuse osseous demineralization. Acute intertrochanteric fracture of the proximal left femur with slight varus angulation. There is a  rounded 1.9 cm nonspecific sclerotic area within the intertrochanteric aspect of the femur. No additional sclerotic or lytic bony abnormalities are seen within the left femur or visualized pelvis. IMPRESSION: 1. Acute intertrochanteric fracture of the proximal left femur with slight varus angulation. 2. Nonspecific 1.9 cm sclerotic area within the intertrochanteric aspect of the left femur. Although this appearance could be posttraumatic, an underlying bone lesion is not excluded. Correlation with prior outside imaging, if available, which includes the proximal femur is recommended. Electronically Signed   By: Duanne Guess D.O.   On: 07/03/2020 12:53    Review of Systems  HENT: Negative for ear discharge, ear pain, hearing loss and tinnitus.   Eyes: Negative for photophobia and pain.  Respiratory: Negative for cough and shortness of breath.   Cardiovascular: Negative for chest pain.  Gastrointestinal: Negative for abdominal pain, nausea and vomiting.  Genitourinary: Negative for dysuria, flank pain, frequency and urgency.  Musculoskeletal: Positive for arthralgias (Left hip). Negative for back pain, myalgias and neck pain.  Neurological: Negative for dizziness and headaches.  Hematological: Does not bruise/bleed easily.  Psychiatric/Behavioral: The patient is not nervous/anxious.    Blood pressure (!) 199/87, pulse 85, temperature 97.6 F (36.4 C), temperature source Oral, resp. rate 20, SpO2 96 %. Physical Exam Constitutional:      General: She is not in acute distress.    Appearance: She is well-developed. She is not diaphoretic.  HENT:     Head: Normocephalic and atraumatic.  Eyes:     General: No scleral icterus.       Right eye: No discharge.        Left eye: No discharge.     Conjunctiva/sclera: Conjunctivae normal.  Cardiovascular:     Rate and Rhythm: Normal rate and regular rhythm.  Pulmonary:     Effort: Pulmonary effort is normal. No respiratory distress.   Musculoskeletal:     Cervical back: Normal range of motion.     Comments: LLE No traumatic wounds, ecchymosis, or rash  Mild TTP hip  No knee or ankle effusion  Knee stable to varus/ valgus and anterior/posterior stress  Sens DPN, SPN, TN intact  Motor EHL, ext, flex, evers 5/5  DP 2+, PT 0, 3+ edema  Skin:    General: Skin is warm and dry.  Neurological:     Mental Status: She is alert.  Psychiatric:        Behavior: Behavior normal.     Assessment/Plan: Left hip fx -- Plan IMN by Dr. Jena Gauss today. Please keep NPO. BLE edema -- Home meds Dementia    Freeman Caldron, PA-C Orthopedic Surgery [phone] 07/03/2020, 1:11 PM

## 2020-07-03 NOTE — Transfer of Care (Signed)
Immediate Anesthesia Transfer of Care Note  Patient: Samantha Hickman  Procedure(s) Performed: INTRAMEDULLARY (IM) NAIL INTERTROCHANTRIC (Left )  Patient Location: PACU  Anesthesia Type:MAC and Spinal  Level of Consciousness: awake and alert   Airway & Oxygen Therapy: Patient Spontanous Breathing and Patient connected to face mask oxygen  Post-op Assessment: Report given to RN and Post -op Vital signs reviewed and stable  Post vital signs: Reviewed and stable  Last Vitals:  Vitals Value Taken Time  BP 99/73 07/03/20 1636  Temp 36.1 C 07/03/20 1636  Pulse 77 07/03/20 1637  Resp 18 07/03/20 1637  SpO2 92 % 07/03/20 1637  Vitals shown include unvalidated device data.  Last Pain:  Vitals:   07/03/20 1159  TempSrc:   PainSc: 4       Patients Stated Pain Goal: 0 (40/34/74 2595)  Complications: No complications documented.

## 2020-07-03 NOTE — Anesthesia Procedure Notes (Signed)
Spinal  Patient location during procedure: OR Start time: 07/03/2020 2:45 PM End time: 07/03/2020 3:00 PM Staffing Performed: anesthesiologist  Anesthesiologist: Lannie Fields, DO Preanesthetic Checklist Completed: patient identified, IV checked, risks and benefits discussed, surgical consent, monitors and equipment checked, pre-op evaluation and timeout performed Spinal Block Patient position: sitting Prep: DuraPrep and site prepped and draped Patient monitoring: cardiac monitor, continuous pulse ox and blood pressure Approach: midline Location: L3-4 Injection technique: single-shot Needle Needle type: Pencan  Needle gauge: 22 G Needle length: 9 cm Assessment Sensory level: T6 Additional Notes Functioning IV was confirmed and monitors were applied. Sterile prep and drape, including hand hygiene and sterile gloves were used. The patient was positioned and the spine was prepped. The skin was anesthetized with lidocaine.  Free flow of clear CSF was obtained prior to injecting local anesthetic into the CSF.  The spinal needle aspirated freely following injection.  The needle was carefully withdrawn.  The patient tolerated the procedure well.

## 2020-07-03 NOTE — ED Triage Notes (Signed)
Patient arrives to ED with [facility] with complaints of fall. Per EMS pt had unwitnessed fall at home while using the bathroom. Denies head or neck injury. Left hip pain/shortened leg. given by EMS.

## 2020-07-03 NOTE — Anesthesia Procedure Notes (Signed)
Procedure Name: MAC Date/Time: 07/03/2020 3:05 PM Performed by: Jenne Campus, CRNA Pre-anesthesia Checklist: Patient identified, Emergency Drugs available, Suction available and Patient being monitored Oxygen Delivery Method: Simple face mask

## 2020-07-03 NOTE — ED Provider Notes (Signed)
[facility] EMERGENCY DEPARTMENT Provider Note   CSN: [id number] Arrival date & time: 07/03/20  1151     History Chief Complaint  Patient presents with  . Fall  . Hip Pain    Samantha Hickman is a 86 y.o. female.  86 yo F with a chief complaint of a fall.  Patient was walking to the bathroom and fell.  Has a sitter that usually stays at home with her.  Today was this particular sitters first day.  Patient with left hip pain afterwards.  She denies any other injury.  Denies head injury loss of consciousness chest pain back pain abdominal pain.  Denies other extremity pain.  The history is provided by the patient and the EMS personnel.  Fall This is a new problem. The current episode started less than 1 hour ago. The problem occurs rarely. The problem has been resolved. Pertinent negatives include no chest pain, no headaches and no shortness of breath. The symptoms are aggravated by bending, twisting and walking. Nothing relieves the symptoms. She has tried nothing for the symptoms. The treatment provided no relief.  Hip Pain Pertinent negatives include no chest pain, no headaches and no shortness of breath.       Past Medical History:  Diagnosis Date  . Memory impairment     There are no problems to display for this patient.   History reviewed. No pertinent surgical history.   OB History   No obstetric history on file.     History reviewed. No pertinent family history.  Social History   Tobacco Use  . Smoking status: Never Smoker  . Smokeless tobacco: Never Used  Substance Use Topics  . Alcohol use: No  . Drug use: Not on file    Home Medications Prior to Admission medications   Medication Sig Start Date End Date Taking? Authorizing Provider  azithromycin (ZITHROMAX) 250 MG tablet Take 1 tablet (250 mg total) by mouth daily. Take first 2 tablets together, then 1 every day until finished. 09/05/17   Elson Areas, PA-C  donepezil (ARICEPT) 10  MG tablet Take 10 mg by mouth at bedtime.    [provider]    Allergies    Patient has no known allergies.  Review of Systems   Review of Systems  Constitutional: Negative for chills and fever.  HENT: Negative for congestion and rhinorrhea.   Eyes: Negative for redness and visual disturbance.  Respiratory: Negative for shortness of breath and wheezing.   Cardiovascular: Negative for chest pain and palpitations.  Gastrointestinal: Negative for nausea and vomiting.  Genitourinary: Negative for dysuria and urgency.  Musculoskeletal: Positive for arthralgias. Negative for myalgias.  Skin: Negative for pallor and wound.  Neurological: Negative for dizziness and headaches.    Physical Exam Updated Vital Signs BP (!) 199/87 (BP Location: Right Arm)   Pulse 85   Temp 97.6 F (36.4 C) (Oral)   Resp 20   SpO2 96%   Physical Exam Vitals and nursing note reviewed.  Constitutional:      General: She is not in acute distress.    Appearance: She is well-developed. She is not diaphoretic.  HENT:     Head: Normocephalic and atraumatic.  Eyes:     Pupils: Pupils are equal, round, and reactive to light.  Cardiovascular:     Rate and Rhythm: Normal rate and regular rhythm.     Heart sounds: No murmur heard.  No friction rub. No gallop.   Pulmonary:  Effort: Pulmonary effort is normal.     Breath sounds: No wheezing or rales.  Abdominal:     General: There is no distension.     Palpations: Abdomen is soft.     Tenderness: There is no abdominal tenderness.  Musculoskeletal:        General: Swelling and tenderness present.     Cervical back: Normal range of motion and neck supple.     Comments: 2+ edema to bilateral extremities up to the knees. Shortened LLE.  Pain with internal and external rotation.  Pelvis stable.  No midline spinal tenderness.   Skin:    General: Skin is warm and dry.  Neurological:     Mental Status: She is alert and oriented to person, place, and  time.  Psychiatric:        Behavior: Behavior normal.     ED Results / Procedures / Treatments   Labs (all labs ordered are listed, but only abnormal results are displayed) Labs Reviewed  BASIC METABOLIC PANEL - Abnormal; Notable for the following components:      Result Value   Glucose, Bld 141 (*)    All other components within normal limits  RESPIRATORY PANEL BY RT PCR (FLU A&B, COVID)  CBC WITH DIFFERENTIAL/PLATELET    EKG EKG Interpretation  Date/Time:  Wednesday July 03 2020 12:00:29 EDT Ventricular Rate:  82 PR Interval:    QRS Duration: 84 QT Interval:  396 QTC Calculation: 463 R Axis:   59 Text Interpretation: Sinus rhythm No old tracing to compare Confirmed by Melene Plan [phone]) on 07/03/2020 12:17:42 PM   Radiology DG Chest 1 View  Result Date: 07/03/2020 CLINICAL DATA:  Pain following fall EXAM: CHEST  1 VIEW COMPARISON:  September 05, 2017 chest radiograph; chest CT October 13, 2017 FINDINGS: There is diffuse interstitial thickening with scattered subcentimeter nodular opacities, also present on prior CT examination. Underlying areas of bronchiectatic change noted in the lower lobe regions. No edema or airspace opacity. Heart is upper normal in size with pulmonary vascularity normal. There is aortic atherosclerosis. Bones are osteoporotic. IMPRESSION: Interstitial thickening with scattered subcentimeter nodular lesions, also seen previously. Underlying bronchiectatic change. There is likely a degree of chronic bronchitis. The appearance on previous CT is felt to be indicative of atypical infectious lesion such as Mycobacterium avium intracellulare. No frank edema or consolidation. Stable cardiac silhouette. Aortic Atherosclerosis (ICD10-I70.0). Electronically Signed   By: Bretta Bang III M.D.   On: 07/03/2020 12:53   DG Hip Unilat W or Wo Pelvis 2-3 Views Left  Result Date: 07/03/2020 CLINICAL DATA:  Left hip pain EXAM: DG HIP (WITH OR WITHOUT PELVIS) 2-3V  LEFT COMPARISON:  None. FINDINGS: Diffuse osseous demineralization. Acute intertrochanteric fracture of the proximal left femur with slight varus angulation. There is a rounded 1.9 cm nonspecific sclerotic area within the intertrochanteric aspect of the femur. No additional sclerotic or lytic bony abnormalities are seen within the left femur or visualized pelvis. IMPRESSION: 1. Acute intertrochanteric fracture of the proximal left femur with slight varus angulation. 2. Nonspecific 1.9 cm sclerotic area within the intertrochanteric aspect of the left femur. Although this appearance could be posttraumatic, an underlying bone lesion is not excluded. Correlation with prior outside imaging, if available, which includes the proximal femur is recommended. Electronically Signed   By: Duanne Guess D.O.   On: 07/03/2020 12:53    Procedures Procedures (including critical care time)  Medications Ordered in ED Medications  chlorhexidine (HIBICLENS) 4 % liquid 4 application (  has no administration in time range)  povidone-iodine 10 % swab 2 application (has no administration in time range)  ceFAZolin (ANCEF) IVPB 2g/100 mL premix (has no administration in time range)    ED Course  I have reviewed the triage vital signs and the nursing notes.  Pertinent labs & imaging results that were available during my care of the patient were reviewed by me and considered in my medical decision making (see chart for details).    MDM Rules/Calculators/A&P                          86 yo F with a chief complaint of left hip pain after a fall.  Plain film of the left hip with a femoral neck fracture is viewed by me.  Discussed with Ortho, evaluated bedside.  Lab work without significant anemia or electrolyte abnormality.  Chest x-ray viewed by me without focal infiltrate or pneumothorax.  Will discuss with medicine for admission.  The patients results and plan were reviewed and discussed.   Any x-rays performed were  independently reviewed by myself.   Differential diagnosis were considered with the presenting HPI.  Medications  chlorhexidine (HIBICLENS) 4 % liquid 4 application (has no administration in time range)  povidone-iodine 10 % swab 2 application (has no administration in time range)  ceFAZolin (ANCEF) IVPB 2g/100 mL premix (has no administration in time range)    Vitals:   07/03/20 1157  BP: (!) 199/87  Pulse: 85  Resp: 20  Temp: 97.6 F (36.4 C)  TempSrc: Oral  SpO2: 96%    Final diagnoses:  Closed left hip fracture, initial encounter (HCC)  Fall, initial encounter    Admission/ observation were discussed with the admitting physician, patient and/or family and they are comfortable with the plan.   Final Clinical Impression(s) / ED Diagnoses Final diagnoses:  Closed left hip fracture, initial encounter [facility])  Fall, initial encounter    Rx / DC Orders ED Discharge Orders    None       Melene Plan, DO 07/03/20 1328

## 2020-07-03 NOTE — Op Note (Signed)
Orthopaedic Surgery Operative Note (CSN: [id number] ) Date of Surgery: 07/03/2020  Admit Date: 07/03/2020   Diagnoses: Pre-Op Diagnoses: Left intertrochanteric femur fracture   Post-Op Diagnosis: Same  Procedures: CPT 27245-Cephalomedullary nailing of left intertrochanteric femur fracture  Surgeons : Primary: Roby Lofts, MD  Assistant: Ulyses Southward, PA-C  Location: OR 5   Anesthesia:Spinal  Antibiotics: Ancef 2g preop with 1 gm vancomycin powder   Tourniquet time:None  Estimated Blood Loss:30 mL  Complications:None   Specimens:None   Implants: Implant Name Type Inv. Item Serial No. Manufacturer Lot No. LRB No. Used Action  NAIL INTERTAN 10X18 130D 10S - [id number] Nail NAIL INTERTAN 10X18 130D 10S  [employer] [id number] Left 1 Implanted  SCREW LAG COMBO 95.90 - [id number] Screw SCREW LAG COMBO 95.90  [employer] [id number] Left 1 Implanted  SCREW TRIGEN LOW PROF 5.0X35 - [id number] Screw SCREW TRIGEN LOW PROF 5.0X35  [employer] [id number] Left 1 Implanted     Indications for Surgery: 86 year old female who sustained a ground-level fall.  She was found to have a left intertrochanteric femur fracture.  I recommend proceeding with cephalomedullary nailing.  Risks and benefits were discussed with the patient's daughter.  Risks include but not limited to bleeding, infection, malunion, nonunion, hardware failure, hardware irritation, nerve and blood vessel injury, DVT, even the possibility of anesthetic complications.  Patient's daughter agreed to proceed with surgery and consent was obtained.  Operative Findings: Cephalomedullary nailing of left intertrochanteric femur fracture using [employer] InterTAN 10 x 180 mm nail with a 95 mm / 90 mm lag screw/compression screw combo  Procedure: The patient was identified in the preoperative holding area. Consent was confirmed with the patient and their family and all questions  were answered. The operative extremity was marked after confirmation with the patient. she was then brought back to the operating room by our anesthesia colleagues.  She was placed under spinal anesthetic.  A Foley catheter was placed.  She was carefully transferred over to a Hana table.  Her feet were positioned appropriately in the boot.  Her arms were well-padded.  Gentle traction was applied to the left lower extremity.  Fluoroscopy was obtained to show adequate reduction.  The left lower extremity was then prepped and draped in usual sterile fashion.  A timeout was performed to verify the patient, the procedure, and the extremity.  Preoperative antibiotics were dosed.  A small incision was made proximal to the greater trochanter.  The gluteal musculature and fascia was split in line with my incision.  I used a curved Mayos to expose the greater troches.  I used a threaded guidewire to find the appropriate starting point using AP and lateral fluoroscopic imaging.  I advanced a guidewire into the metaphysis.  I then used an entry reamer to enter the medullary canal.  I then attached the short InterTAN nail to a targeting arm and placed it in the medullary canal.  I then made a percutaneous incision and used the targeting arm to place a guidewire into the head neck segment.  I confirmed adequate tip apex distance with fluoroscopic imaging.  I then measured the length and chose to use a 95 mm lag screw and a 90 mm compression screw  I drilled the path for the compression screw and placed an antirotation bar.  I then drilled the path for the lag screw.  I placed the lag screw and then provided about 5 mm of compression with the  compression screw.  The nail was then statically locked.  I then proceeded to place one distal interlock through the targeting arm.  The targeting arm was removed.  Final fluoroscopic imaging was obtained.  The incisions were copiously irrigated.  A gram of vancomycin powder was placed  between the 2 incisions.  Layer closure of 2-0 Vicryl, 3-0 Monocryl and Dermabond was used.  Sterile dressings were placed.  The patient was awoken from anesthesia and taken to the PACU in stable condition.  Post Op Plan/Instructions: Patient will be weightbearing as tolerated to left lower extremity.  She will receive postoperative Ancef.  She will be started on Lovenox for DVT prophylaxis.  We will have her mobilize with physical and Occupational Therapy.  I was present and performed the entire surgery.  Ulyses Southward, PA-C did assist me throughout the case. An assistant was necessary given the difficulty in approach, maintenance of reduction and ability to instrument the fracture.   Truitt Merle, MD [facility]

## 2020-07-03 NOTE — Anesthesia Postprocedure Evaluation (Signed)
Anesthesia Post Note  Patient: Samantha Hickman  Procedure(s) Performed: INTRAMEDULLARY (IM) NAIL INTERTROCHANTRIC (Left )     Patient location during evaluation: Nursing Unit Anesthesia Type: MAC and Spinal Level of consciousness: oriented and awake and alert Pain management: pain level controlled Vital Signs Assessment: post-procedure vital signs reviewed and stable Respiratory status: spontaneous breathing and respiratory function stable Cardiovascular status: blood pressure returned to baseline and stable Postop Assessment: no headache, no backache, no apparent nausea or vomiting and patient able to bend at knees Anesthetic complications: no   No complications documented.  Last Vitals:  Vitals:   07/03/20 1705 07/03/20 1721  BP: 128/85 99/86  Pulse: 84 86  Resp: 19 19  Temp:    SpO2: 100% 100%    Last Pain:  Vitals:   07/03/20 1705  TempSrc:   PainSc: 0-No pain                 Barnet Glasgow

## 2020-07-03 NOTE — Interval H&P Note (Signed)
History and Physical Interval Note:  07/03/2020 1:58 PM  Samantha Hickman  has presented today for surgery, with the diagnosis of Left intertroch fracture .  The various methods of treatment have been discussed with the patient and family. After consideration of risks, benefits and other options for treatment, the patient has consented to  Procedure(s): INTRAMEDULLARY (IM) NAIL INTERTROCHANTRIC (Left) as a surgical intervention.  The patient's history has been reviewed, patient examined, no change in status, stable for surgery.  I have reviewed the patient's chart and labs.  Questions were answered to the patient's satisfaction.     Caryn Bee P Davianna Deutschman

## 2020-07-04 ENCOUNTER — Encounter (HOSPITAL_COMMUNITY): Payer: Self-pay | Admitting: Student

## 2020-07-04 DIAGNOSIS — F0390 Unspecified dementia without behavioral disturbance: Secondary | ICD-10-CM | POA: Diagnosis present

## 2020-07-04 DIAGNOSIS — S72142A Displaced intertrochanteric fracture of left femur, initial encounter for closed fracture: Principal | ICD-10-CM

## 2020-07-04 DIAGNOSIS — A310 Pulmonary mycobacterial infection: Secondary | ICD-10-CM | POA: Diagnosis present

## 2020-07-04 LAB — VITAMIN D 25 HYDROXY (VIT D DEFICIENCY, FRACTURES): Vit D, 25-Hydroxy: 6.77 ng/mL — ABNORMAL LOW (ref 30–100)

## 2020-07-04 LAB — CBC
HCT: 36.2 % (ref 36.0–46.0)
Hemoglobin: 11.2 g/dL — ABNORMAL LOW (ref 12.0–15.0)
MCH: 27.4 pg (ref 26.0–34.0)
MCHC: 30.9 g/dL (ref 30.0–36.0)
MCV: 88.5 fL (ref 80.0–100.0)
Platelets: 255 10*3/uL (ref 150–400)
RBC: 4.09 MIL/uL (ref 3.87–5.11)
RDW: 13.3 % (ref 11.5–15.5)
WBC: 13.1 10*3/uL — ABNORMAL HIGH (ref 4.0–10.5)
nRBC: 0 % (ref 0.0–0.2)

## 2020-07-04 LAB — BASIC METABOLIC PANEL
Anion gap: 12 (ref 5–15)
BUN: 14 mg/dL (ref 8–23)
CO2: 21 mmol/L — ABNORMAL LOW (ref 22–32)
Calcium: 8.6 mg/dL — ABNORMAL LOW (ref 8.9–10.3)
Chloride: 103 mmol/L (ref 98–111)
Creatinine, Ser: 1.18 mg/dL — ABNORMAL HIGH (ref 0.44–1.00)
GFR, Estimated: 44 mL/min — ABNORMAL LOW (ref >60)
Glucose, Bld: 151 mg/dL — ABNORMAL HIGH (ref 70–99)
Potassium: 4.5 mmol/L (ref 3.5–5.1)
Sodium: 136 mmol/L (ref 135–145)

## 2020-07-04 MED ORDER — ADULT MULTIVITAMIN W/MINERALS CH
1.0000 | ORAL_TABLET | Freq: Every day | ORAL | Status: DC
  Administered 2020-07-05 – 2020-07-08 (×4): 1 via ORAL
  Filled 2020-07-04 (×5): qty 1

## 2020-07-04 MED ORDER — ENSURE ENLIVE PO LIQD
237.0000 mL | Freq: Two times a day (BID) | ORAL | Status: DC
  Administered 2020-07-05 – 2020-07-08 (×4): 237 mL via ORAL

## 2020-07-04 MED ORDER — SODIUM CHLORIDE 0.9 % IV BOLUS
500.0000 mL | Freq: Once | INTRAVENOUS | Status: AC
  Administered 2020-07-04: 500 mL via INTRAVENOUS

## 2020-07-04 NOTE — Progress Notes (Signed)
Initial Nutrition Assessment  DOCUMENTATION CODES:   Not applicable  INTERVENTION:   -Obtain current wt -Ensure Enlive po BID, each supplement provides 350 kcal and 20 grams of protein -MVI with minerals daily -Feeding assistance with meals  NUTRITION DIAGNOSIS:   Increased nutrient needs related to post-op healing as evidenced by estimated needs.  GOAL:   Patient will meet greater than or equal to 90% of their needs  MONITOR:   PO intake, Supplement acceptance, Labs, Weight trends, Skin, I & O's  REASON FOR ASSESSMENT:   Consult Assessment of nutrition requirement/status, Hip fracture protocol  ASSESSMENT:   Samantha Hickman is an 86 y.o. female with h/o dementia presenting with fall with hip fracture.  Patient lives at home alone with periodic caregivers.  She apparently suffered an unwitnessed fall at home.  She is now s/p cephalomedullary nailing of left intertrochanteric hip fracture.  At the time of my evaluation this AM, she was pleasant and complained of L hip pain (nurse reported he had recently given Robaxin and Norco).  She was not oriented even to person.  Pt admitted with lt hip fracture s/p fall.  10/27- s/p cephalomedullary nailing of lt intertochanteric femur fracture  Pt sitting in recliner chair at time of visit. She did not respond to voice or touch. Noted lunch tray at bedside unattempted.   No wt hx available to review at this time. Pt with increased nutritional needs for post-operative healing and would benefit from addition of oral nutrition supplements.   Medications reviewed and include colace and lasix.   Labs reviewed.   NUTRITION - FOCUSED PHYSICAL EXAM:    Most Recent Value  Orbital Region No depletion  Upper Arm Region No depletion  Thoracic and Lumbar Region No depletion  Buccal Region No depletion  Temple Region No depletion  Clavicle Bone Region No depletion  Clavicle and Acromion Bone Region No depletion  Scapular Bone Region No  depletion  Dorsal Hand No depletion  Patellar Region No depletion  Anterior Thigh Region No depletion  Posterior Calf Region No depletion  Edema (RD Assessment) None  Hair Reviewed  Eyes Reviewed  Mouth Reviewed  Skin Reviewed  Nails Reviewed       Diet Order:   Diet Order            Diet Heart Room service appropriate? No; Fluid consistency: Thin  Diet effective now                 EDUCATION NEEDS:   Not appropriate for education at this time  Skin:  Skin Assessment: Skin Integrity Issues: Skin Integrity Issues:: Incisions Incisions: lt hip  Last BM:  Unknown  Height:   Ht Readings from Last 1 Encounters:  07/04/20 5\' 1"  (1.549 m)    Weight:   Wt Readings from Last 1 Encounters:  No data found for Wt    Ideal Body Weight:  47.7 kg  BMI:  There is no height or weight on file to calculate BMI.  Estimated Nutritional Needs:   Kcal:  1450-1650  Protein:  65-80 grams  Fluid:  > 1.4 L    , RD, LDN, CDCES Registered Dietitian II Certified Diabetes Care and Education Specialist Please refer to [facility] for RD and/or RD on-call/weekend/after hours pager

## 2020-07-04 NOTE — Evaluation (Signed)
Occupational Therapy Evaluation Patient Details Name: Samantha Hickman MRN: [id number] DOB: [birth year] Today's Date: 07/04/2020    History of Present Illness Pt is an 86 y.o. female admitted 07/03/20 after unwitnessed fall sustaining L intertrochanteric femur fx. S/p L femur cephalomedullary nailing 10/27. PMH includes memory impairment.   Clinical Impression   Pt presents now s/p fall with diagnoses and surgical interventions noted above. Pt poor historian, only oriented to self; per chart, from "home with help" and required encouragement to use walker. Pt with deficits in cognition, endurance, strength, and balance. Pt only began to experience pain in L LE when weightbearing during session. Pt overall Min A x 2 for sit to stand transfer, then required Max A x 2 for pivot to chair due to L LE weakness. Pt requires Min A for UB ADLs and up to Max A for LB ADLs due to deficits. Recommend short term rehab at SNF to maximize independence and safety during daily tasks. Plan to further address ADL transfers and sequencing daily tasks safely during next session.     Follow Up Recommendations  SNF    Equipment Recommendations  Other (comment) (to be determined)    Recommendations for Other Services       Precautions / Restrictions Precautions Precautions: Fall Restrictions Weight Bearing Restrictions: Yes LLE Weight Bearing: Weight bearing as tolerated Other Position/Activity Restrictions: WBAT L LE      Mobility Bed Mobility Overal bed mobility: Needs Assistance Bed Mobility: Supine to Sit     Supine to sit: Mod assist;+2 for physical assistance;HOB elevated     General bed mobility comments: ModA+2 to initiate movement, manage BLEs and elevate trunk, pt able to assist with BUE support when cued to use bed rails    Transfers Overall transfer level: Needs assistance Equipment used: Rolling walker (2 wheeled) Transfers: Sit to/from [employer] Sit to Stand: Min  assist;+2 physical assistance;Max assist Stand pivot transfers: Max assist;+2 physical assistance;+2 safety/equipment       General transfer comment: Performed 2x sit<>stand from EOB with minA+2 to elevate trunk to RW; pt with difficulty accepting weight onto LLE with knee buckling, ultimately requiring maxA+2 to pivot to recliner    Balance Overall balance assessment: Needs assistance;History of Falls Sitting-balance support: No upper extremity supported;Feet supported Sitting balance-Leahy Scale: Fair Sitting balance - Comments: Initial assist to maintain trunk elevation progressing to min guard-supervision   Standing balance support: Bilateral upper extremity supported;During functional activity Standing balance-Leahy Scale: Poor Standing balance comment: Can maintain static standing with UE support and minA, difficulty WBAT through LLE                           ADL either performed or assessed with clinical judgement   ADL Overall ADL's : Needs assistance/impaired Eating/Feeding: Set up;Sitting   Grooming: Sitting;Minimal assistance;Brushing hair Grooming Details (indicate cue type and reason): MIn A for thoroughness of brushing hair seated EOB. Able to wash face with supervision, cues for initiation Upper Body Bathing: Minimal assistance;Sitting   Lower Body Bathing: Maximal assistance;Sit to/from stand   Upper Body Dressing : Minimal assistance;Sitting   Lower Body Dressing: Maximal assistance;Sit to/from stand   Toilet Transfer: Maximal assistance;Stand-pivot;BSC;RW;+2 for physical assistance;+2 for safety/equipment Toilet Transfer Details (indicate cue type and reason): simulated to recliner Toileting- Clothing Manipulation and Hygiene: Maximal assistance;Sit to/from stand         General ADL Comments: Pt limited by cognition, operative LE weakness  Vision Baseline Vision/History:  (pt unable to report) Patient Visual Report: Other (comment) (pt  unable to report) Vision Assessment?: Vision impaired- to be further tested in functional context Additional Comments: Difficulty reaching for/locating bedrail to side - denies hx of visual impairment, but unsure of baseline     Perception     Praxis      Pertinent Vitals/Pain Pain Assessment: Faces Faces Pain Scale: Hurts a little bit Pain Location: LLE with WB Pain Descriptors / Indicators: Grimacing Pain Intervention(s): Limited activity within patient's tolerance;Monitored during session;Repositioned     Hand Dominance Right   Extremity/Trunk Assessment Upper Extremity Assessment Upper Extremity Assessment: Generalized weakness;Difficult to assess due to impaired cognition   Lower Extremity Assessment Lower Extremity Assessment: Defer to PT evaluation LLE Deficits / Details: s/p L femur nailing; hip and knee functionally <3/5, limited by post-op pain and weakness LLE Coordination: decreased gross motor;decreased fine motor   Cervical / Trunk Assessment Cervical / Trunk Assessment: Kyphotic;Other exceptions Cervical / Trunk Exceptions: forward flexed neck/rounded shoulders   Communication Communication Communication: No difficulties   Cognition Arousal/Alertness: Awake/alert Behavior During Therapy: WFL for tasks assessed/performed;Flat affect Overall Cognitive Status: History of cognitive impairments - at baseline Area of Impairment: Orientation;Memory;Following commands;Safety/judgement;Awareness;Problem solving                 Orientation Level: Disoriented to;Place;Time;Situation   Memory: Decreased recall of precautions;Decreased short-term memory Following Commands: Follows one step commands consistently;Follows one step commands with increased time Safety/Judgement: Decreased awareness of safety;Decreased awareness of deficits Awareness: Intellectual Problem Solving: Decreased initiation;Difficulty sequencing;Requires verbal cues;Requires tactile  cues General Comments: Per chart, "h/o memory impairment." Pt pleasantly confused, only oriented to self, reports living with her parents. Surprised to hear she is in hospital. Following majority of simple commands appropriately, but difficulty initiating some tasks   General Comments  SpO2 WFL on RA    Exercises Other Exercises Other Exercises: PROM L knee flex/ext (pt not moving to command)   Shoulder Instructions      Home Living Family/patient expects to be discharged to:: Skilled nursing facility                                 Additional Comments: Pt only oriented to self, poor historian, reports living with her parents      Prior Functioning/Environment Level of Independence: Needs assistance        Comments: Per chart, has RW but does not use resulting in fall, "from home with help." Pt unable to answer PLOF questions        OT Problem List: Decreased strength;Decreased activity tolerance;Impaired balance (sitting and/or standing);Decreased safety awareness;Decreased knowledge of use of DME or AE;Decreased knowledge of precautions;Decreased cognition;Pain      OT Treatment/Interventions: Self-care/ADL training;Therapeutic exercise;Energy conservation;DME and/or AE instruction;Therapeutic activities;Patient/family education    OT Goals(Current goals can be found in the care plan section) Acute Rehab OT Goals Patient Stated Goal: None stated OT Goal Formulation: Patient unable to participate in goal setting Time For Goal Achievement: 07/18/20 Potential to Achieve Goals: Fair ADL Goals Pt Will Perform Upper Body Dressing: with supervision;sitting Pt Will Perform Lower Body Dressing: with min assist;sit to/from stand;sitting/lateral leans Pt Will Transfer to Toilet: with min assist;stand pivot transfer;bedside commode Pt Will Perform Toileting - Clothing Manipulation and hygiene: with min assist;sitting/lateral leans;sit to/from stand  OT Frequency: Min  2X/week   Barriers to D/C:  Co-evaluation PT/OT/SLP Co-Evaluation/Treatment: Yes Reason for Co-Treatment: Necessary to address cognition/behavior during functional activity;For patient/therapist safety;To address functional/ADL transfers PT goals addressed during session: Mobility/safety with mobility;Balance;Proper use of DME OT goals addressed during session: ADL's and self-care;Other (comment) (functional transfers)      AM-PAC OT "6 Clicks" Daily Activity     Outcome Measure Help from another person eating meals?: A Little Help from another person taking care of personal grooming?: A Little Help from another person toileting, which includes using toliet, bedpan, or urinal?: Total Help from another person bathing (including washing, rinsing, drying)?: Total Help from another person to put on and taking off regular upper body clothing?: A Little Help from another person to put on and taking off regular lower body clothing?: A Lot 6 Click Score: 13   End of Session Equipment Utilized During Treatment: Rolling walker;Gait belt Nurse Communication: Mobility status  Activity Tolerance: Patient tolerated treatment well Patient left: in chair;with call bell/phone within reach;with chair alarm set  OT Visit Diagnosis: Unsteadiness on feet (R26.81);Other abnormalities of gait and mobility (R26.89);Muscle weakness (generalized) (M62.81);History of falling (Z91.81);Pain;Other symptoms and signs involving cognitive function Pain - Right/Left: Left Pain - part of body: Hip                Time: 1017-5102 OT Time Calculation (min): 20 min Charges:  OT General Charges $OT Visit: 1 Visit OT Evaluation $OT Eval Moderate Complexity: 1 Mod  Lorre Munroe, OTR/L  Lorre Munroe 07/04/2020, 12:00 PM

## 2020-07-04 NOTE — Progress Notes (Signed)
RE: Samantha Hickman Date of Birth:  09/28/2017 Date: 09/28/2017  Please be advised that the above-named patient will require a short-term nursing home stay - anticipated 30 days or less for rehabilitation and strengthening.  The plan is for return home.

## 2020-07-04 NOTE — NC FL2 (Addendum)
MEDICAID FL2 LEVEL OF CARE SCREENING TOOL     IDENTIFICATION  Patient Name: Samantha Hickman Birthdate: August 04, 1929 Sex: female Admission Date (Current Location): 07/03/2020  [facility] and [place] Number:  Producer, television/film/video and Address:  [facility], [address] [place], [place] [address]      Provider Number: [id number]  Attending Physician Name and Address:  Roby Lofts, MD  Relative Name and Phone Number:       Current Level of Care: Hospital Recommended Level of Care: Skilled Nursing Facility Prior Approval Number:    Date Approved/Denied:   PASRR Number:   [id number]  Discharge Plan: SNF    Current Diagnoses: Patient Active Problem List   Diagnosis Date Noted  . Dementia without behavioral disturbance (HCC) 07/04/2020  . Mycobacterium avium infection (HCC) 07/04/2020  . Displaced intertrochanteric fracture of left femur, initial encounter for closed fracture (HCC) 07/03/2020    Orientation RESPIRATION BLADDER Height & Weight     Self (memory impairment)  O2 ([place] @ 2l/min) External catheter Weight:   Height:     BEHAVIORAL SYMPTOMS/MOOD NEUROLOGICAL BOWEL NUTRITION STATUS      Continent Diet (refer to d/c summary)  AMBULATORY STATUS COMMUNICATION OF NEEDS Skin   Extensive Assist Verbally Surgical wounds (INTRAMEDULLARY (IM) NAIL INTERTROCHANTRIC (Left ))                       Personal Care Assistance Level of Assistance  Bathing, Feeding, Dressing Bathing Assistance: Maximum assistance Feeding assistance: Limited assistance Dressing Assistance: Maximum assistance     Functional Limitations Info  Sight, Hearing, Speech Sight Info: Adequate Hearing Info: Adequate Speech Info: Adequate    SPECIAL CARE FACTORS FREQUENCY  PT (By licensed PT), OT (By licensed OT)     PT Frequency: 5x/week, evaluate and treat OT Frequency: 5x/week, evaluate and treat            Contractures Contractures Info: Not present     Additional Factors Info  Code Status, Allergies Code Status Info: Full Code Allergies Info: No known allergies           Current Medications (07/04/2020):  This is the current hospital active medication list Current Facility-Administered Medications  Medication Dose Route Frequency Provider Last Rate Last Admin  . 0.9 % NaCl with KCl 20 mEq/ L  infusion   Intravenous Continuous Despina Hidden, PA-C 100 mL/hr at 07/03/20 1900 New Bag at 07/03/20 1900  . acetaminophen (TYLENOL) tablet 325-650 mg  325-650 mg Oral Q6H PRN Despina Hidden, PA-C      . ceFAZolin (ANCEF) IVPB 2g/100 mL premix  2 g Intravenous Q8H Ulyses Southward A, PA-C 200 mL/hr at 07/04/20 0622 2 g at 07/04/20 0622  . docusate sodium (COLACE) capsule 100 mg  100 mg Oral BID Ulyses Southward A, PA-C   100 mg at 07/04/20 4174  . enoxaparin (LOVENOX) injection 40 mg  40 mg Subcutaneous Q24H Ulyses Southward A, PA-C   40 mg at 07/04/20 0854  . furosemide (LASIX) tablet 20 mg  20 mg Oral Daily Ulyses Southward A, PA-C   20 mg at 07/04/20 0854  . HYDROcodone-acetaminophen (NORCO/VICODIN) 5-325 MG per tablet 1-2 tablet  1-2 tablet Oral Q4H PRN Despina Hidden, PA-C   1 tablet at 07/04/20 1111  . methocarbamol (ROBAXIN) tablet 500 mg  500 mg Oral Q6H PRN Despina Hidden, PA-C   500 mg at 07/04/20 1111   Or  .  methocarbamol (ROBAXIN) 500 mg in dextrose 5 % 50 mL IVPB  500 mg Intravenous Q6H PRN Despina Hidden, PA-C      . metoCLOPramide (REGLAN) tablet 5-10 mg  5-10 mg Oral Q8H PRN Ulyses Southward A, PA-C       Or  . metoCLOPramide (REGLAN) injection 5-10 mg  5-10 mg Intravenous Q8H PRN Ulyses Southward A, PA-C      . morphine 2 MG/ML injection 0.5-1 mg  0.5-1 mg Intravenous Q2H PRN Despina Hidden, PA-C      . ondansetron (ZOFRAN) tablet 4 mg  4 mg Oral Q6H PRN Despina Hidden, PA-C       Or  . ondansetron (ZOFRAN) injection 4 mg  4 mg Intravenous Q6H PRN Despina Hidden, PA-C      . polyethylene glycol (MIRALAX / GLYCOLAX) packet 17 g  17 g  Oral Daily PRN Despina Hidden, PA-C         Discharge Medications: Please see discharge summary for a list of discharge medications.  Relevant Imaging Results:  Relevant Lab Results:   Additional Information SS # [id number]  Epifanio Lesches, RN

## 2020-07-04 NOTE — Progress Notes (Signed)
At 1500, pt havent voided after foley been taken out this morning, Bladder scan 78ml, MD notified and order fluid bolus to be given to the patient per MD. If patient is unable to void after the fluid bolus then put foley.

## 2020-07-04 NOTE — Consult Note (Signed)
Medical Consultation   Samantha Hickman  FGH:[id number]  DOB: [birth year]  DOA: 07/03/2020  PCP: Patient, No Pcp Per   Outpatient Specialists: None   Requesting physician: [facility] - orthopedics  Reason for consultation: Hip fracture yesterday, admitted by ortho trauma.  Needs transfer of care to [facility].   History of Present Illness: Samantha Hickman is an 86 y.o. female with h/o dementia presenting with fall with hip fracture.  Patient lives at home alone with periodic caregivers.  She apparently suffered an unwitnessed fall at home.  She is now s/p cephalomedullary nailing of left intertrochanteric hip fracture.  At the time of my evaluation this AM, she was pleasant and complained of L hip pain (nurse reported he had recently given Robaxin and Norco).  She was not oriented even to person.  I called her son, Neliah Cuyler, [phone]; no answer.  I then called her son, Evanny Ellerbe, [phone]; also no answer.  I then called her daughter, Sonda Primes, [phone].  Her daughter reports that sometimes she recognizes family and sometimes she doesn't, "sometimes it's really bad."  They were using [employer] during the day.  She is very sedentary except to use the bathroom or go to the kitchen.  They are hoping to get her placed; they have had trouble placing her due to [place] and her dementia.   They think she would prefer a natural death, family all in agreement.   Review of Systems:  ROS Unable to effectively perform   Past Medical History: Past Medical History:  Diagnosis Date  . Memory impairment     Past Surgical History: Past Surgical History:  Procedure Laterality Date  . ORIF FEMUR FRACTURE Left 07/03/2020     Allergies:  No Known Allergies   Social History:  reports that she has never smoked. She has never used smokeless tobacco. She reports that she does not drink alcohol. No history on file for drug use.   Family History: History reviewed. No  pertinent family history.    Physical Exam: Vitals:   07/04/20 0000 07/04/20 0404 07/04/20 0812 07/04/20 1204  BP: (!) 155/72 117/73 125/60 (!) 97/53  Pulse: 90 93 87 88  Resp: _0 Temp: 97.9 F (36.6 C) 98 F (36.7 C) 98.3 F (36.8 C) 98.4 F (36.9 C)  TempSrc: Oral Oral Oral Oral  SpO2: 95% 100% 100% 99%    Constitutional: Alert and awake, oriented x0, not in any acute distress but with c/o L hip pain Eyes:  EOMI, irises appear normal, anicteric sclera  ENMT: external ears and nose appear normal, normal hearing, Lips appear normal, oropharynx mucosa, tongue appear normal  Neck: neck appears normal, no masses, normal ROM CVS: S1-S2 clear, no murmur rubs or gallops, no LE edema, normal pedal pulses  Respiratory:  clear to auscultation bilaterally, no wheezing, rales or rhonchi. Respiratory effort normal. No accessory muscle use.  Abdomen: soft nontender, nondistended Musculoskeletal: : no cyanosis, clubbing or edema noted bilaterally; small surgical bandage on left lateral hip Neuro: Cranial nerves II-XII grossly intact Psych: severe dementia noted, not even oriented to person Skin: no rashes or lesions or ulcers, no induration or nodules    Data reviewed:  I have personally reviewed the recent labs and imaging studies  Pertinent Labs from 10/27:   Glucose 141 Normal CBC COVID/flu negative   Inpatient Medications:   Scheduled Meds: . docusate sodium  100 mg  Oral BID  . enoxaparin (LOVENOX) injection  40 mg Subcutaneous Q24H  . furosemide  20 mg Oral Daily   Continuous Infusions: . 0.9 % NaCl with KCl 20 mEq / L 100 mL/hr at 07/03/20 1900  .  ceFAZolin (ANCEF) IV 2 g (07/04/20 0622)  . methocarbamol (ROBAXIN) IV       Radiological Exams on Admission: DG Chest 1 View  Result Date: 07/03/2020 CLINICAL DATA:  Pain following fall EXAM: CHEST  1 VIEW COMPARISON:  September 05, 2017 chest radiograph; chest CT October 13, 2017 FINDINGS: There is diffuse  interstitial thickening with scattered subcentimeter nodular opacities, also present on prior CT examination. Underlying areas of bronchiectatic change noted in the lower lobe regions. No edema or airspace opacity. Heart is upper normal in size with pulmonary vascularity normal. There is aortic atherosclerosis. Bones are osteoporotic. IMPRESSION: Interstitial thickening with scattered subcentimeter nodular lesions, also seen previously. Underlying bronchiectatic change. There is likely a degree of chronic bronchitis. The appearance on previous CT is felt to be indicative of atypical infectious lesion such as Mycobacterium avium intracellulare. No frank edema or consolidation. Stable cardiac silhouette. Aortic Atherosclerosis (ICD10-I70.0). Electronically Signed   By: Lowella Grip III M.D.   On: 07/03/2020 12:53   DG C-Arm 1-60 Min  Result Date: 07/03/2020 CLINICAL DATA:  Open reduction internal fixation. EXAM: OPERATIVE LEFT HIP (WITH PELVIS IF PERFORMED) 6 VIEWS TECHNIQUE: Fluoroscopic spot image(s) were submitted for interpretation post-operatively. COMPARISON:  Hip radiographs 07/03/2020 FINDINGS: Fluoroscopic time: 1 minutes and 28 seconds. Six C-arm fluoroscopic images were obtained intraoperatively and submitted for post operative interpretation. These images demonstrate cephalomedullary nail fixation of a left intertrochanteric femur fracture with improved, near anatomic alignment. Please see the performing provider's procedural report for further detail. IMPRESSION: Intraoperative fluoroscopic imaging, as detailed above. Electronically Signed   By: Margaretha Sheffield MD   On: 07/03/2020 16:38   DG HIP PORT UNILAT W OR W/O PELVIS 1V LEFT  Result Date: 07/03/2020 CLINICAL DATA:  Postop. EXAM: DG HIP (WITH OR WITHOUT PELVIS) 1V PORT LEFT COMPARISON:  Preoperative radiographs earlier this day. FINDINGS: Intramedullary nail with 2 trans trochanteric and single distal locking screw fixation of  intertrochanteric femur fracture. The intertrochanteric fracture is in improved alignment compared to preoperative imaging. There is a lucency of the lateral proximal femoral shaft adjacent to the distal locking screw that may abut the femoral stem that was not seen on preoperative radiograph. Bones are diffusely under mineralized. Pubic rami are intact. IMPRESSION: 1. ORIF of intertrochanteric left femur fracture, in improved alignment compared to preoperative imaging. 2. Linear lucency of the lateral proximal femoral shaft adjacent to the distal locking screw, may abut the femoral stem, not seen on preoperative radiograph, suspicious for periprosthetic fracture. Recommend attention on follow-up. Electronically Signed   By: Keith Rake M.D.   On: 07/03/2020 19:03   DG HIP OPERATIVE UNILAT WITH PELVIS LEFT  Result Date: 07/03/2020 CLINICAL DATA:  Open reduction internal fixation. EXAM: OPERATIVE LEFT HIP (WITH PELVIS IF PERFORMED) 6 VIEWS TECHNIQUE: Fluoroscopic spot image(s) were submitted for interpretation post-operatively. COMPARISON:  Hip radiographs 07/03/2020 FINDINGS: Fluoroscopic time: 1 minutes and 28 seconds. Six C-arm fluoroscopic images were obtained intraoperatively and submitted for post operative interpretation. These images demonstrate cephalomedullary nail fixation of a left intertrochanteric femur fracture with improved, near anatomic alignment. Please see the performing provider's procedural report for further detail. IMPRESSION: Intraoperative fluoroscopic imaging, as detailed above. Electronically Signed   By: Margaretha Sheffield MD   On:  07/03/2020 16:38   DG Hip Unilat W or Wo Pelvis 2-3 Views Left  Result Date: 07/03/2020 CLINICAL DATA:  Left hip pain EXAM: DG HIP (WITH OR WITHOUT PELVIS) 2-3V LEFT COMPARISON:  None. FINDINGS: Diffuse osseous demineralization. Acute intertrochanteric fracture of the proximal left femur with slight varus angulation. There is a rounded 1.9 cm  nonspecific sclerotic area within the intertrochanteric aspect of the femur. No additional sclerotic or lytic bony abnormalities are seen within the left femur or visualized pelvis. IMPRESSION: 1. Acute intertrochanteric fracture of the proximal left femur with slight varus angulation. 2. Nonspecific 1.9 cm sclerotic area within the intertrochanteric aspect of the left femur. Although this appearance could be posttraumatic, an underlying bone lesion is not excluded. Correlation with prior outside imaging, if available, which includes the proximal femur is recommended. Electronically Signed   By: Davina Poke D.O.   On: 07/03/2020 12:53    Impression/Recommendations Principal Problem:   Displaced intertrochanteric fracture of left femur, initial encounter for closed fracture [facility]) Active Problems:   Dementia without behavioral disturbance (HCC)  Hip fracture -Mechanical fall resulting in hip fracture -Orthopedics consulted and performed ORIF yesterday -Orthopedics started Lovenox post-operatively -Pain control with Robxain, Vicodin, and Morphine prn -SW consult for rehab placement -Will need PT consult post-operatively  Dementia -Advanced dementia as evidenced by patient not even oriented to herself -She has been living at home with periodic caregivers -This appears to be insufficient for ongoing care -She is very likely to need short-term rehab (if able to participate) followed by long-term placement -Goals of care reviewed with daughter and patient is DNR  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 Valeta Harms -Patient would need pulmonary outpatient f/u -However, treatment is generally several antibiotics for 1-2 years and is not well tolerated -Given the circumstances, will not plan further evaluation/treatment at this time   DVT prophylaxis:  Lovenox Code Status:  DNR - confirmed with family Family Communication: None present; I attempted  to call both sons and eventually reached and spoke with the daughter by telephone at the time of consultation  Disposition Plan:  SNF Consults called: Orthopedics; TOC team, Nutrition; PT/OT Admission status: Admit - It is my clinical opinion that admission to INPATIENT is reasonable and necessary because of the expectation that this patient will require hospital care that crosses at least 2 midnights to treat this condition based on the medical complexity of the problems presented.  Given the aforementioned information, the predictability of an adverse outcome is felt to be significant.     Thank you for this consultation.  Our [facility] hospitalist team will assume care of the patient and follow the patient with you.   Time Spent: 70 minutes  Karmen Bongo M.D. [facility] 07/04/2020, 1:22 PM

## 2020-07-04 NOTE — Evaluation (Signed)
Physical Therapy Evaluation Patient Details Name: Samantha Hickman MRN: [id number] DOB: Mar 30, 1929 Today's Date: 07/04/2020   History of Present Illness  Pt is an 86 y.o. female admitted 07/03/20 after unwitnessed fall sustaining L intertrochanteric femur fx. S/p L femur cephalomedullary nailing 10/27. PMH includes memory impairment.    Clinical Impression  Pt presents with an overall decrease in functional mobility secondary to above. Pt poor historian, only oriented to self; per chart, from "home with help" and admitted after fall. Today, pt able to stand to RW with minA+2, but unable to initiate steps due to difficulty WB on LLE; required maxA+2 to pivot to recliner. Pt not consistently following command; pleasantly confused throughout session with no c/o pain. Pt would benefit from continued acute PT services to maximize functional mobility and independence prior to d/c with SNF-level therapies.      Follow Up Recommendations SNF;Supervision/Assistance - 24 hour    Equipment Recommendations   (defer)    Recommendations for Other Services       Precautions / Restrictions Precautions Precautions: Fall Restrictions Weight Bearing Restrictions: Yes LLE Weight Bearing: Weight bearing as tolerated Other Position/Activity Restrictions: WBAT L LE      Mobility  Bed Mobility Overal bed mobility: Needs Assistance Bed Mobility: Supine to Sit     Supine to sit: Mod assist;+2 for physical assistance;HOB elevated     General bed mobility comments: ModA+2 to initiate movement, manage BLEs and elevate trunk, pt able to assist with BUE support when cued to use bed rails    Transfers Overall transfer level: Needs assistance Equipment used: Rolling walker (2 wheeled) Transfers: Sit to/from [employer] Sit to Stand: Min assist;+2 physical assistance;Max assist         General transfer comment: Performed 2x sit<>stand from EOB with minA+2 to elevate trunk to RW; pt with  difficulty accepting weight onto LLE with knee buckling, ultimately requiring maxA+2 to pivot to recliner  Ambulation/Gait             General Gait Details: Unable  Stairs            Wheelchair Mobility    Modified Rankin (Stroke Patients Only)       Balance Overall balance assessment: Needs assistance Sitting-balance support: No upper extremity supported;Feet supported Sitting balance-Leahy Scale: Fair Sitting balance - Comments: Initial assist to maintain trunk elevation progressing to min guard-supervision     Standing balance-Leahy Scale: Poor Standing balance comment: Can maintain static standing with UE support and minA, difficulty WBAT through LLE                             Pertinent Vitals/Pain Pain Assessment: Faces Faces Pain Scale: Hurts a little bit Pain Location: LLE with WB Pain Descriptors / Indicators: Grimacing Pain Intervention(s): Monitored during session;Repositioned    Home Living Family/patient expects to be discharged to:: Skilled nursing facility                 Additional Comments: Pt only oriented to self, poor historian, reports living with her parents    Prior Function           Comments: Per chart, has RW but does not use resulting in fall, "from home with help." Pt unable to answer PLOF questions     Hand Dominance        Extremity/Trunk Assessment   Upper Extremity Assessment Upper Extremity Assessment: Generalized weakness;Difficult to assess due to impaired cognition  Lower Extremity Assessment Lower Extremity Assessment: LLE deficits/detail;Difficult to assess due to impaired cognition LLE Deficits / Details: s/p L femur nailing; hip and knee functionally <3/5, limited by post-op pain and weakness LLE Coordination: decreased gross motor;decreased fine motor    Cervical / Trunk Assessment Cervical / Trunk Assessment: Kyphotic;Other exceptions Cervical / Trunk Exceptions: forward flexed  neck/rounded shoulders  Communication      Cognition Arousal/Alertness: Awake/alert Behavior During Therapy: WFL for tasks assessed/performed;Flat affect Overall Cognitive Status: History of cognitive impairments - at baseline Area of Impairment: Orientation;Memory;Following commands;Safety/judgement;Awareness;Problem solving                 Orientation Level: Disoriented to;Place;Time;Situation   Memory: Decreased recall of precautions;Decreased short-term memory Following Commands: Follows one step commands consistently;Follows one step commands with increased time Safety/Judgement: Decreased awareness of safety;Decreased awareness of deficits Awareness: Intellectual Problem Solving: Decreased initiation;Difficulty sequencing;Requires verbal cues;Requires tactile cues General Comments: Per chart, "h/o memory impairment." Pt pleasantly confused, only oriented to self, reports living with her parents. Surprised to hear she is in hospital. Following majority of simple commands appropriately, but difficulty initiating some tasks      General Comments General comments (skin integrity, edema, etc.): SpO2 88-94% on RA (trialled finger and ear probes), RN notified left off O2 [place]    Exercises Other Exercises Other Exercises: PROM L knee flex/ext (pt not moving to command)   Assessment/Plan    PT Assessment Patient needs continued PT services  PT Problem List Decreased strength;Decreased range of motion;Decreased activity tolerance;Decreased balance;Decreased mobility;Decreased cognition;Decreased knowledge of use of DME;Decreased knowledge of precautions       PT Treatment Interventions DME instruction;Gait training;Functional mobility training;Therapeutic activities;Therapeutic exercise;Balance training;Patient/family education;Cognitive remediation;Wheelchair mobility training    PT Goals (Current goals can be found in the Care Plan section)  Acute Rehab PT Goals Patient Stated  Goal: None stated PT Goal Formulation: Patient unable to participate in goal setting Time For Goal Achievement: 07/18/20 Potential to Achieve Goals: Fair    Frequency Min 3X/week   Barriers to discharge        Co-evaluation PT/OT/SLP Co-Evaluation/Treatment: Yes Reason for Co-Treatment: Necessary to address cognition/behavior during functional activity;For patient/therapist safety;To address functional/ADL transfers PT goals addressed during session: Mobility/safety with mobility;Balance;Proper use of DME         AM-PAC PT "6 Clicks" Mobility  Outcome Measure Help needed turning from your back to your side while in a flat bed without using bedrails?: A Lot Help needed moving from lying on your back to sitting on the side of a flat bed without using bedrails?: A Lot Help needed moving to and from a bed to a chair (including a wheelchair)?: Total Help needed standing up from a chair using your arms (e.g., wheelchair or bedside chair)?: A Little Help needed to walk in hospital room?: Total Help needed climbing 3-5 steps with a railing? : Total 6 Click Score: 10    End of Session Equipment Utilized During Treatment: Gait belt Activity Tolerance: Patient tolerated treatment well Patient left: in chair;with call bell/phone within reach;with chair alarm set Nurse Communication: Mobility status PT Visit Diagnosis: Other abnormalities of gait and mobility (R26.89);Muscle weakness (generalized) (M62.81)    Time: 5188-4166 PT Time Calculation (min) (ACUTE ONLY): 25 min   Charges:   PT Evaluation $PT Eval Moderate Complexity: 1 Mod         Ina Homes, PT, DPT Acute Rehabilitation Services  Pager [phone] Office [phone]  Malachy Chamber 07/04/2020, 10:54 AM

## 2020-07-04 NOTE — TOC Initial Note (Signed)
Transition of Care [facility]) - Initial/Assessment Note    Patient Details  Name: Samantha Hickman MRN: [id number] Date of Birth: November 02, 1928  Transition of Care [facility]) CM/SW Contact:    Epifanio Lesches, RN Phone Number: 07/04/2020, 3:36 PM  Clinical Narrative:         Admitted after a fall, suffered L intertrochanteric femur fx. From home alone. Pt receives PCS from [employer] during the day and family cares for pt in the evening/hs.        - S/p L femur cephalomedullary nailing 10/27. PMH includes memory impairment.    RNCM received consult for possible SNF placement at time of discharge. Pt with memory impairment , oriented x1 ( self). RNCM spoke with patient's son Gery Pray [facility]) regarding PT recommendation of SNF placement at time of discharge. Gery Pray reported that pt  needs short term rehab after falling and fx  given patient's current physical needs and fall risk. Gery Pray expressed understanding of PT recommendation and is agreeable to SNF placement at time of discharge. Gery Pray without  preference for  SNF. RNCM discussed insurance authorization process and provided Medicare SNF ratings list. No further questions reported at this time. RNCM to continue to follow and assist with discharge planning needs.  Pt has not been COVID vaccinated.  Expected Discharge Plan: Skilled Nursing Facility Barriers to Discharge: Continued Medical Work up   Patient Goals and CMS Choice   CMS Medicare.gov Compare Post Acute Care list provided to:: Patient Choice offered to / list presented to : Adult Children Gery Pray)  Expected Discharge Plan and Services Expected Discharge Plan: Skilled Nursing Facility   Discharge Planning Services: CM Consult   Living arrangements for the past 2 months: Single Family Home                                      Prior Living Arrangements/Services Living arrangements for the past 2 months: Single Family Home Lives with:: Self Advice worker) Patient  language and need for interpreter reviewed:: Yes Do you feel safe going back to the place where you live?: Yes      Need for Family Participation in Patient Care: Yes (Comment) Care giver support system in place?: Yes (comment)   Criminal Activity/Legal Involvement Pertinent to Current Situation/Hospitalization: No - Comment as needed  Activities of Daily Living      Permission Sought/Granted                  Emotional Assessment Appearance:: Appears stated age Attitude/Demeanor/Rapport: Gracious Affect (typically observed): Accepting Orientation: : Oriented to Self Alcohol / Substance Use: Not Applicable Psych Involvement: No (comment)  Admission diagnosis:  Fall, initial encounter [W19.XXXA] Closed left hip fracture, initial encounter (HCC) [S72.002A] Intertrochanteric fracture of left femur (HCC) [S72.142A] Patient Active Problem List   Diagnosis Date Noted  . Dementia without behavioral disturbance (HCC) 07/04/2020  . Mycobacterium avium infection (HCC) 07/04/2020  . Displaced intertrochanteric fracture of left femur, initial encounter for closed fracture (HCC) 07/03/2020   PCP:  Patient, No Pcp Per Pharmacy:   [facility] [address] [facility] - [place], [place] - [address] [place] [address] Phone: [phone] Fax: [phone]     Social Determinants of Health (SDOH) Interventions    Readmission Risk Interventions No flowsheet data found.

## 2020-07-04 NOTE — Progress Notes (Signed)
Orthopaedic Trauma Progress Note  S: Doing fair this morning, no specific complaints. Denies any signficiant pain in the left leg. Medicine team was consulted for admission by ED physician yesterday at time of patient's arrival, medicine team has not seen patient yet and therefore patient was admitted to orthopaedic trauma service post-operatively. Still awaiting medicine consult.  O:  Vitals:   07/04/20 0000 07/04/20 0404  BP: (!) 155/72 117/73  Pulse: 90 93  Resp: 17 18  Temp: 97.9 F (36.6 C) 98 F (36.7 C)  SpO2: 95% 100%    General: Laying in bed, NAD. Demented. Respiratory: No increased work of breathing.  Left Lower Extremity: Dressings CDI. No significant tenderness with palpation throughout hip/thigh. Tolerated small amount of knee motion. Able to actively dorsiflex/plantarflex ankle. Reports pain with movement of leg. +DP pulse  Imaging: Stable post op imaging.   Labs:  Results for orders placed or performed during the hospital encounter of 07/03/20 (from the past 24 hour(s))  CBC with Differential     Status: None   Collection Time: 07/03/20 11:59 AM  Result Value Ref Range   WBC 8.7 4.0 - 10.5 K/uL   RBC 4.64 3.87 - 5.11 MIL/uL   Hemoglobin 12.7 12.0 - 15.0 g/dL   HCT 28.3 36 - 46 %   MCV 90.9 80.0 - 100.0 fL   MCH 27.4 26.0 - 34.0 pg   MCHC 30.1 30.0 - 36.0 g/dL   RDW 15.1 76.1 - 60.7 %   Platelets 234 150 - 400 K/uL   nRBC 0.0 0.0 - 0.2 %   Neutrophils Relative % 81 %   Neutro Abs 7.1 1.7 - 7.7 K/uL   Lymphocytes Relative 12 %   Lymphs Abs 1.0 0.7 - 4.0 K/uL   Monocytes Relative 4 %   Monocytes Absolute 0.4 0.1 - 1.0 K/uL   Eosinophils Relative 2 %   Eosinophils Absolute 0.2 0.0 - 0.5 K/uL   Basophils Relative 1 %   Basophils Absolute 0.1 0.0 - 0.1 K/uL   Immature Granulocytes 0 %   Abs Immature Granulocytes 0.03 0.00 - 0.07 K/uL  Basic metabolic panel     Status: Abnormal   Collection Time: 07/03/20 11:59 AM  Result Value Ref Range   Sodium 137 135 -  145 mmol/L   Potassium 4.4 3.5 - 5.1 mmol/L   Chloride 103 98 - 111 mmol/L   CO2 27 22 - 32 mmol/L   Glucose, Bld 141 (H) 70 - 99 mg/dL   BUN 14 8 - 23 mg/dL   Creatinine, Ser 3.71 0.44 - 1.00 mg/dL   Calcium 9.0 8.9 - 06.2 mg/dL   GFR, Estimated >69 >48 mL/min   Anion gap 7 5 - 15  Respiratory Panel by RT PCR (Flu A&B, Covid) - Nasopharyngeal Swab     Status: None   Collection Time: 07/03/20 12:04 PM   Specimen: Nasopharyngeal Swab  Result Value Ref Range   SARS Coronavirus 2 by RT PCR NEGATIVE NEGATIVE   Influenza A by PCR NEGATIVE NEGATIVE   Influenza B by PCR NEGATIVE NEGATIVE    Assessment: 86 year old female s/p fall, 1 Day Post-Op   Injuries: Left intertrochanteric femur fracture s/p cephalomedullary nailing  Weightbearing: WBAT LLE  Insicional and dressing care:  Plan to remove dressing tomorrow  Showering: Ok to begin showering with assistance 07/06/20  Orthopedic device(s): None   CV/Blood loss: Hgb 11.9 on admission, CBC pending this AM. Hemodynamically stable  Pain management:  1. Tylenol 325-650  mg q 6 hours PRN 2. Robaxin 500 mg q 6 hours PRN 3. Norco 5-325 mg, 1-2 tablets q 6 hours PRN 4. Morphine 0.5-1 mg q 2 hours PRN  VTE prophylaxis: Lovenox starting today SCDs: ordered, have placed on BLE   ID: Ancef 2gm post op  Foley/Lines: Remove foley this morning. KVO IVFs  Medical co-morbidities: Dementia  Impediments to Fracture Healing: Vit D level pending, will start supplementation as indicated  Dispo: PT/OT eval today, dispo pending. Will likely need SNF.   Follow - up plan: 2 weeks  Contact information:  Truitt Merle MD, Ulyses Southward PA-C   Chandy Tarman A. Ladonna Snide [facility] [phone] (office) [email or web address]

## 2020-07-05 ENCOUNTER — Encounter (HOSPITAL_COMMUNITY): Payer: Self-pay | Admitting: Student

## 2020-07-05 LAB — BASIC METABOLIC PANEL
Anion gap: 5 (ref 5–15)
BUN: 24 mg/dL — ABNORMAL HIGH (ref 8–23)
CO2: 26 mmol/L (ref 22–32)
Calcium: 8.5 mg/dL — ABNORMAL LOW (ref 8.9–10.3)
Chloride: 104 mmol/L (ref 98–111)
Creatinine, Ser: 1.15 mg/dL — ABNORMAL HIGH (ref 0.44–1.00)
GFR, Estimated: 46 mL/min — ABNORMAL LOW (ref >60)
Glucose, Bld: 140 mg/dL — ABNORMAL HIGH (ref 70–99)
Potassium: 4.7 mmol/L (ref 3.5–5.1)
Sodium: 135 mmol/L (ref 135–145)

## 2020-07-05 LAB — CBC
HCT: 31 % — ABNORMAL LOW (ref 36.0–46.0)
Hemoglobin: 9.6 g/dL — ABNORMAL LOW (ref 12.0–15.0)
MCH: 27.7 pg (ref 26.0–34.0)
MCHC: 31 g/dL (ref 30.0–36.0)
MCV: 89.6 fL (ref 80.0–100.0)
Platelets: 181 10*3/uL (ref 150–400)
RBC: 3.46 MIL/uL — ABNORMAL LOW (ref 3.87–5.11)
RDW: 13.5 % (ref 11.5–15.5)
WBC: 11.6 10*3/uL — ABNORMAL HIGH (ref 4.0–10.5)
nRBC: 0 % (ref 0.0–0.2)

## 2020-07-05 LAB — SARS CORONAVIRUS 2 BY RT PCR (HOSPITAL ORDER, PERFORMED IN ~~LOC~~ HOSPITAL LAB): SARS Coronavirus 2: NEGATIVE

## 2020-07-05 MED ORDER — HYDROCODONE-ACETAMINOPHEN 5-325 MG PO TABS: 1.0000 | ORAL_TABLET | Freq: Four times a day (QID) | ORAL | 0 refills | Status: DC | PRN

## 2020-07-05 MED ORDER — METHOCARBAMOL 500 MG PO TABS: 500.0000 mg | ORAL_TABLET | Freq: Four times a day (QID) | ORAL | 0 refills | Status: DC | PRN

## 2020-07-05 MED ORDER — VITAMIN D (ERGOCALCIFEROL) 1.25 MG (50000 UNIT) PO CAPS: 50000.0000 [IU] | ORAL_CAPSULE | ORAL | 0 refills | Status: AC

## 2020-07-05 MED ORDER — VITAMIN D3 25 MCG PO TABS: 2000.0000 [IU] | ORAL_TABLET | Freq: Two times a day (BID) | ORAL | 2 refills | Status: DC

## 2020-07-05 MED ORDER — ENOXAPARIN SODIUM 40 MG/0.4ML ~~LOC~~ SOLN: 40.0000 mg | SUBCUTANEOUS | 0 refills | Status: DC

## 2020-07-05 MED ORDER — VITAMIN D (ERGOCALCIFEROL) 1.25 MG (50000 UNIT) PO CAPS: 50000.0000 [IU] | ORAL_CAPSULE | ORAL | 0 refills | Status: DC

## 2020-07-05 MED ORDER — VITAMIN D (ERGOCALCIFEROL) 1.25 MG (50000 UNIT) PO CAPS
50000.0000 [IU] | ORAL_CAPSULE | ORAL | Status: DC
  Administered 2020-07-05: 50000 [IU] via ORAL
  Filled 2020-07-05 (×3): qty 1

## 2020-07-05 MED ORDER — VITAMIN D 25 MCG (1000 UNIT) PO TABS
2000.0000 [IU] | ORAL_TABLET | Freq: Two times a day (BID) | ORAL | Status: DC
  Administered 2020-07-05 – 2020-07-08 (×7): 2000 [IU] via ORAL
  Filled 2020-07-05 (×8): qty 2

## 2020-07-05 NOTE — Care Management Important Message (Signed)
Important Message  Patient Details  Name: Samantha Hickman MRN: [id number] Date of Birth: 07-18-1929   Medicare Important Message Given:  Yes - Important Message mailed due to current National Emergency  Verbal consent obtained due to current National Emergency  Relationship to patient: Self Contact Name: Earla Charlie Call Date: 07/05/20  Time: 1308 Phone: [phone] Outcome: No Answer/Busy Important Message mailed to: Patient address on file    Orson Aloe 07/05/2020, 1:08 PM

## 2020-07-05 NOTE — Progress Notes (Signed)
PROGRESS NOTE    Samantha Hickman  EYC:[id number] DOB: 1928-09-16 DOA: 07/03/2020 PCP: Patient, No Pcp Per    Brief Narrative:  Samantha Hickman is a 48 female with past medical history remarkable for dementia who presented with unwitnessed fall at home and found to have left hip fracture. She was initially admitted by the orthopedic trauma service and underwent cephalic medullary narrowing of her left intertrochanteric hip fracture on 07/03/2020.  Hospitalist service was consulted by orthopedics to assume care on 07/04/2020.   Assessment & Plan:   Principal Problem:   Displaced intertrochanteric fracture of left femur, initial encounter for closed fracture [facility]) Active Problems:   Dementia without behavioral disturbance ([place])   Mycobacterium avium infection ([place])   Displaced intertrochanteric left femur fracture Patient presenting from home following unwitnessed fall.  Found to have a displaced left intratrochanteric fracture of the left femur.  Patient underwent cephalic medullary nailing by orthopedics, Dr. Doreatha Martin on 07/03/2020. --Pain control with Robaxin, Vicodin, morphine IV as needed --Postoperative DVT prophylaxis with Lovenox --Pending SNF placement  Vitamin D deficiency Vitamin D-25 low at 6.77. --Ergocalciferol 50,000 units oral weekly. --Coley Calciferol 2000 units twice daily  Advanced dementia Patient has been residing at home with periodic caregivers. 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 Valeta Harms.  Treatment is generally several antibiotics for 1-2 years and not well-tolerated.  Given patient's underlying circumstances, advanced dementia no further plan for evaluation or treatment at this time.   DVT prophylaxis: Lovenox Code Status: DNR Family Communication: Attempted to contact patient's son, Alvester Chou via telephone, unsuccessful, voicemail  left.  Disposition Plan:  Status is: Inpatient  Remains inpatient appropriate because:Unsafe d/c plan   Dispo: The patient is from: Home              Anticipated d/c is to: SNF              Anticipated d/c date is: 1 day              Patient currently is medically stable to d/c.   Consultants:   Orthopedics, Dr. Doreatha Martin  Procedures:   Cephalomedullary nailing, Dr. Doreatha Martin 10/27  Antimicrobials:   Perioperative cefazolin   Subjective: Seen and examined bedside, resting comfortably.  Eating breakfast.  No complaints this morning.  Pleasantly confused.  Denies headache, no chest pain, no shortness of breath, no abdominal pain.  Awaiting SNF placement.  No family present this morning.  No acute concerns per nursing staff.  Objective: Vitals:   07/04/20 1727 07/04/20 1953 07/05/20 0058 07/05/20 0500  BP:  110/62 (!) 133/58 (!) 139/54  Pulse:  96 94 99  Resp:  _0 Temp:  97.7 F (36.5 C) 98.9 F (37.2 C) 97.7 F (36.5 C)  TempSrc:  Oral Oral Oral  SpO2:  100% 96% 96%  Height: _1  (1.549 m)       Intake/Output Summary (Last 24 hours) at 07/05/2020 1217 Last data filed at 07/05/2020 1497 Gross per 24 hour  Intake 2194.35 ml  Output 900 ml  Net 1294.35 ml   There were no vitals filed for this visit.  Examination:  General exam: Appears calm and comfortable, pleasantly confused Respiratory system: Clear to auscultation. Respiratory effort normal.  Doing well on room air Cardiovascular system: S1 & S2 heard, RRR. No JVD, murmurs, rubs, gallops or clicks. No pedal edema. Gastrointestinal system: Abdomen is nondistended, soft and  nontender. No organomegaly or masses felt. Normal bowel sounds heard. Central nervous system: Alert, not oriented to person/place/time/situation. No focal neurological deficits. Extremities: Symmetric 5 x 5 power. Skin: No rashes, lesions or ulcers, surgical site noted with dressing in place, clean/dry/intact Psychiatry: Judgement and  insight appear normal. Mood & affect appropriate.     Data Reviewed: I have personally reviewed following labs and imaging studies  CBC: Recent Labs  Lab 07/03/20 1159 07/04/20 0943 07/05/20 0451  WBC 8.7 13.1* 11.6*  NEUTROABS 7.1  --   --   HGB 12.7 11.2* 9.6*  HCT 42.2 36.2 31.0*  MCV 90.9 88.5 89.6  PLT 234 255 563   Basic Metabolic Panel: Recent Labs  Lab 07/03/20 1159 07/04/20 0943 07/05/20 0451  NA 137 136 135  K 4.4 4.5 4.7  CL 103 103 104  CO2 27 21* 26  GLUCOSE 141* 151* 140*  BUN 14 14 24*  CREATININE 0.86 1.18* 1.15*  CALCIUM 9.0 8.6* 8.5*   GFR: CrCl cannot be calculated (Unknown ideal weight.). Liver Function Tests: No results for input(s): AST, ALT, ALKPHOS, BILITOT, PROT, ALBUMIN in the last 168 hours. No results for input(s): LIPASE, AMYLASE in the last 168 hours. No results for input(s): AMMONIA in the last 168 hours. Coagulation Profile: No results for input(s): INR, PROTIME in the last 168 hours. Cardiac Enzymes: No results for input(s): CKTOTAL, CKMB, CKMBINDEX, TROPONINI in the last 168 hours. BNP (last 3 results) No results for input(s): PROBNP in the last 8760 hours. HbA1C: No results for input(s): HGBA1C in the last 72 hours. CBG: No results for input(s): GLUCAP in the last 168 hours. Lipid Profile: No results for input(s): CHOL, HDL, LDLCALC, TRIG, CHOLHDL, LDLDIRECT in the last 72 hours. Thyroid Function Tests: No results for input(s): TSH, T4TOTAL, FREET4, T3FREE, THYROIDAB in the last 72 hours. Anemia Panel: No results for input(s): VITAMINB12, FOLATE, FERRITIN, TIBC, IRON, RETICCTPCT in the last 72 hours. Sepsis Labs: No results for input(s): PROCALCITON, LATICACIDVEN in the last 168 hours.  Recent Results (from the past 240 hour(s))  Respiratory Panel by RT PCR (Flu A&B, Covid) - Nasopharyngeal Swab     Status: None   Collection Time: 07/03/20 12:04 PM   Specimen: Nasopharyngeal Swab  Result Value Ref Range Status   SARS  Coronavirus 2 by RT PCR NEGATIVE NEGATIVE Final    Comment: (NOTE) SARS-CoV-2 target nucleic acids are NOT DETECTED.  The SARS-CoV-2 RNA is generally detectable in upper respiratoy specimens during the acute phase of infection. The lowest concentration of SARS-CoV-2 viral copies this assay can detect is 131 copies/mL. A negative result does not preclude SARS-Cov-2 infection and should not be used as the sole basis for treatment or other patient management decisions. A negative result may occur with  improper specimen collection/handling, submission of specimen other than nasopharyngeal swab, presence of viral mutation(s) within the areas targeted by this assay, and inadequate number of viral copies (<131 copies/mL). A negative result must be combined with clinical observations, patient history, and epidemiological information. The expected result is Negative.  Fact Sheet for Patients:  PinkCheek.be  Fact Sheet for Healthcare Providers:  GravelBags.it  This test is no t yet approved or cleared by the [place] FDA and  has been authorized for detection and/or diagnosis of SARS-CoV-2 by FDA under an Emergency Use Authorization (EUA). This EUA will remain  in effect (meaning this test can be used) for the duration of the COVID-19 declaration under Section 564(b)(1) of the Act, 21  U.S.C. section 360bbb-3(b)(1), unless the authorization is terminated or revoked sooner.     Influenza A by PCR NEGATIVE NEGATIVE Final   Influenza B by PCR NEGATIVE NEGATIVE Final    Comment: (NOTE) The Xpert Xpress SARS-CoV-2/FLU/RSV assay is intended as an aid in  the diagnosis of influenza from Nasopharyngeal swab specimens and  should not be used as a sole basis for treatment. Nasal washings and  aspirates are unacceptable for Xpert Xpress SARS-CoV-2/FLU/RSV  testing.  Fact Sheet for  Patients: PinkCheek.be  Fact Sheet for Healthcare Providers: GravelBags.it  This test is not yet approved or cleared by the [place] FDA and  has been authorized for detection and/or diagnosis of SARS-CoV-2 by  FDA under an Emergency Use Authorization (EUA). This EUA will remain  in effect (meaning this test can be used) for the duration of the  Covid-19 declaration under Section 564(b)(1) of the Act, 21  U.S.C. section 360bbb-3(b)(1), unless the authorization is  terminated or revoked. Performed at [facility], [place] [address], [place], [place] [address]          Radiology Studies: DG Chest 1 View  Result Date: 07/03/2020 CLINICAL DATA:  Pain following fall EXAM: CHEST  1 VIEW COMPARISON:  September 05, 2017 chest radiograph; chest CT October 13, 2017 FINDINGS: There is diffuse interstitial thickening with scattered subcentimeter nodular opacities, also present on prior CT examination. Underlying areas of bronchiectatic change noted in the lower lobe regions. No edema or airspace opacity. Heart is upper normal in size with pulmonary vascularity normal. There is aortic atherosclerosis. Bones are osteoporotic. IMPRESSION: Interstitial thickening with scattered subcentimeter nodular lesions, also seen previously. Underlying bronchiectatic change. There is likely a degree of chronic bronchitis. The appearance on previous CT is felt to be indicative of atypical infectious lesion such as Mycobacterium avium intracellulare. No frank edema or consolidation. Stable cardiac silhouette. Aortic Atherosclerosis (ICD10-I70.0). Electronically Signed   By: Lowella Grip III M.D.   On: 07/03/2020 12:53   DG C-Arm 1-60 Min  Result Date: 07/03/2020 CLINICAL DATA:  Open reduction internal fixation. EXAM: OPERATIVE LEFT HIP (WITH PELVIS IF PERFORMED) 6 VIEWS TECHNIQUE: Fluoroscopic spot image(s) were submitted for interpretation  post-operatively. COMPARISON:  Hip radiographs 07/03/2020 FINDINGS: Fluoroscopic time: 1 minutes and 28 seconds. Six C-arm fluoroscopic images were obtained intraoperatively and submitted for post operative interpretation. These images demonstrate cephalomedullary nail fixation of a left intertrochanteric femur fracture with improved, near anatomic alignment. Please see the performing provider's procedural report for further detail. IMPRESSION: Intraoperative fluoroscopic imaging, as detailed above. Electronically Signed   By: Margaretha Sheffield MD   On: 07/03/2020 16:38   DG HIP PORT UNILAT W OR W/O PELVIS 1V LEFT  Result Date: 07/03/2020 CLINICAL DATA:  Postop. EXAM: DG HIP (WITH OR WITHOUT PELVIS) 1V PORT LEFT COMPARISON:  Preoperative radiographs earlier this day. FINDINGS: Intramedullary nail with 2 trans trochanteric and single distal locking screw fixation of intertrochanteric femur fracture. The intertrochanteric fracture is in improved alignment compared to preoperative imaging. There is a lucency of the lateral proximal femoral shaft adjacent to the distal locking screw that may abut the femoral stem that was not seen on preoperative radiograph. Bones are diffusely under mineralized. Pubic rami are intact. IMPRESSION: 1. ORIF of intertrochanteric left femur fracture, in improved alignment compared to preoperative imaging. 2. Linear lucency of the lateral proximal femoral shaft adjacent to the distal locking screw, may abut the femoral stem, not seen on preoperative radiograph, suspicious for periprosthetic fracture. Recommend  attention on follow-up. Electronically Signed   By: Keith Rake M.D.   On: 07/03/2020 19:03   DG HIP OPERATIVE UNILAT WITH PELVIS LEFT  Result Date: 07/03/2020 CLINICAL DATA:  Open reduction internal fixation. EXAM: OPERATIVE LEFT HIP (WITH PELVIS IF PERFORMED) 6 VIEWS TECHNIQUE: Fluoroscopic spot image(s) were submitted for interpretation post-operatively. COMPARISON:   Hip radiographs 07/03/2020 FINDINGS: Fluoroscopic time: 1 minutes and 28 seconds. Six C-arm fluoroscopic images were obtained intraoperatively and submitted for post operative interpretation. These images demonstrate cephalomedullary nail fixation of a left intertrochanteric femur fracture with improved, near anatomic alignment. Please see the performing provider's procedural report for further detail. IMPRESSION: Intraoperative fluoroscopic imaging, as detailed above. Electronically Signed   By: Margaretha Sheffield MD   On: 07/03/2020 16:38   DG Hip Unilat W or Wo Pelvis 2-3 Views Left  Result Date: 07/03/2020 CLINICAL DATA:  Left hip pain EXAM: DG HIP (WITH OR WITHOUT PELVIS) 2-3V LEFT COMPARISON:  None. FINDINGS: Diffuse osseous demineralization. Acute intertrochanteric fracture of the proximal left femur with slight varus angulation. There is a rounded 1.9 cm nonspecific sclerotic area within the intertrochanteric aspect of the femur. No additional sclerotic or lytic bony abnormalities are seen within the left femur or visualized pelvis. IMPRESSION: 1. Acute intertrochanteric fracture of the proximal left femur with slight varus angulation. 2. Nonspecific 1.9 cm sclerotic area within the intertrochanteric aspect of the left femur. Although this appearance could be posttraumatic, an underlying bone lesion is not excluded. Correlation with prior outside imaging, if available, which includes the proximal femur is recommended. Electronically Signed   By: Davina Poke D.O.   On: 07/03/2020 12:53      Scheduled Meds: . cholecalciferol  2,000 Units Oral BID  . docusate sodium  100 mg Oral BID  . enoxaparin (LOVENOX) injection  40 mg Subcutaneous Q24H  . feeding supplement  237 mL Oral BID BM  . furosemide  20 mg Oral Daily  . multivitamin with minerals  1 tablet Oral Daily  . Vitamin D (Ergocalciferol)  50,000 Units Oral Q7 days   Continuous Infusions: . 0.9 % NaCl with KCl 20 mEq / L 100 mL/hr at  07/03/20 1900  . methocarbamol (ROBAXIN) IV       LOS: 2 days    Time spent: 35 minutes spent on chart review, discussion with nursing staff, consultants, updating family and interview/physical exam; more than 50% of that time was spent in counseling and/or coordination of care.    Tamekia Rotter J [place] DO [facility] Available via Epic secure chat 7am-7pm After these hours, please refer to coverage provider listed on [email or web address] 07/05/2020, 12:17 PM

## 2020-07-05 NOTE — TOC Progression Note (Signed)
Transition of Care [facility]) - Progression Note    Patient Details  Name: Samantha Hickman MRN: [id number] Date of Birth: [birth year]  Transition of [facility]) CM/SW Contact  Epifanio Lesches, RN Phone Number: [phone] 07/05/2020, 4:12 PM  Clinical Narrative:     SNF bed offers noted. NCM made attempt via phone to pt's sons and daughter. Calls not answered, unable to leave voice message.   TOC team will continue monitor and follow....  Expected Discharge Plan: Skilled Nursing Facility Barriers to Discharge: No SNF bed  Expected Discharge Plan and Services Expected Discharge Plan: Skilled Nursing Facility   Discharge Planning Services: CM Consult   Living arrangements for the past 2 months: Single Family Home                                       Social Determinants of Health (SDOH) Interventions    Readmission Risk Interventions No flowsheet data found.

## 2020-07-05 NOTE — Plan of Care (Signed)
Patient is s/p IM nailing of left femur post op day 2. Mepilex dsg to left lower extremity clean dry and intact. Patient is not complaining of any pain at this time Bed alarm on per fall safety precautions. Plan for discharge is SNF placement - waiting on the family to choose one. No apparent distress or needs voiced at this time. Will continue to monitor.

## 2020-07-05 NOTE — Progress Notes (Signed)
Orthopaedic Trauma Progress Note  S: Doing well this morning, denies any significant pain in the left leg. Was able to work with therapy yesterday and transfer to bedside chair but did experience pain in left leg when weightbearing. No other issues of note this morning.   O:  Vitals:   07/05/20 0058 07/05/20 0500  BP: (!) 133/58 (!) 139/54  Pulse: 94 99  Resp: 16 15  Temp: 98.9 F (37.2 C) 97.7 F (36.5 C)  SpO2: 96% 96%    General: Laying in bed, NAD. Pleasantly demented. Respiratory: No increased work of breathing.  Left Lower Extremity: Dressings removed, incisions are CDI. Applied new Mepilex dressings. Mild bruising over lateral thigh. No significant tenderness with palpation throughout hip/thigh. Tolerated small amount of knee motion. Able to actively dorsiflex/plantarflex ankle. Reports pain with movement of leg. +DP pulse  Imaging: Stable post op imaging.   Labs:  Results for orders placed or performed during the hospital encounter of 07/03/20 (from the past 24 hour(s))  VITAMIN D 25 Hydroxy (Vit-D Deficiency, Fractures)     Status: Abnormal   Collection Time: 07/04/20  9:43 AM  Result Value Ref Range   Vit D, 25-Hydroxy 6.77 (L) 30 - 100 ng/mL  Basic metabolic panel     Status: Abnormal   Collection Time: 07/04/20  9:43 AM  Result Value Ref Range   Sodium 136 135 - 145 mmol/L   Potassium 4.5 3.5 - 5.1 mmol/L   Chloride 103 98 - 111 mmol/L   CO2 21 (L) 22 - 32 mmol/L   Glucose, Bld 151 (H) 70 - 99 mg/dL   BUN 14 8 - 23 mg/dL   Creatinine, Ser 7.40 (H) 0.44 - 1.00 mg/dL   Calcium 8.6 (L) 8.9 - 10.3 mg/dL   GFR, Estimated 44 (L) >60 mL/min   Anion gap 12 5 - 15  CBC     Status: Abnormal   Collection Time: 07/04/20  9:43 AM  Result Value Ref Range   WBC 13.1 (H) 4.0 - 10.5 K/uL   RBC 4.09 3.87 - 5.11 MIL/uL   Hemoglobin 11.2 (L) 12.0 - 15.0 g/dL   HCT 81.4 36 - 46 %   MCV 88.5 80.0 - 100.0 fL   MCH 27.4 26.0 - 34.0 pg   MCHC 30.9 30.0 - 36.0 g/dL   RDW 48.1  85.6 - 31.4 %   Platelets 255 150 - 400 K/uL   nRBC 0.0 0.0 - 0.2 %  Basic metabolic panel     Status: Abnormal   Collection Time: 07/05/20  4:51 AM  Result Value Ref Range   Sodium 135 135 - 145 mmol/L   Potassium 4.7 3.5 - 5.1 mmol/L   Chloride 104 98 - 111 mmol/L   CO2 26 22 - 32 mmol/L   Glucose, Bld 140 (H) 70 - 99 mg/dL   BUN 24 (H) 8 - 23 mg/dL   Creatinine, Ser 9.70 (H) 0.44 - 1.00 mg/dL   Calcium 8.5 (L) 8.9 - 10.3 mg/dL   GFR, Estimated 46 (L) >60 mL/min   Anion gap 5 5 - 15  CBC     Status: Abnormal   Collection Time: 07/05/20  4:51 AM  Result Value Ref Range   WBC 11.6 (H) 4.0 - 10.5 K/uL   RBC 3.46 (L) 3.87 - 5.11 MIL/uL   Hemoglobin 9.6 (L) 12.0 - 15.0 g/dL   HCT 26.3 (L) 36 - 46 %   MCV 89.6 80.0 - 100.0 fL   MCH  27.7 26.0 - 34.0 pg   MCHC 31.0 30.0 - 36.0 g/dL   RDW 79.0 24.0 - 97.3 %   Platelets 181 150 - 400 K/uL   nRBC 0.0 0.0 - 0.2 %    Assessment: 86 year old female s/p fall, 2 Days Post-Op   Injuries: Left intertrochanteric femur fracture s/p cephalomedullary nailing  Weightbearing: WBAT LLE  Insicional and dressing care: Okay to leave incisions open to air  Showering: Ok to begin showering with assistance 07/06/20  Orthopedic device(s): None   CV/Blood loss: Acute blood loss anemia, hemoglobin 9.6 this morning.  Hemodynamically stable  Pain management:  1. Tylenol 325-650 mg q 6 hours PRN 2. Robaxin 500 mg q 6 hours PRN 3. Norco 5-325 mg, 1-2 tablets q 6 hours PRN 4. Morphine 0.5-1 mg q 2 hours PRN  VTE prophylaxis: Lovenox  SCDs: ordered, have placed on BLE   ID: Ancef 2gm post op completed  Foley/Lines: No Foley.  KVO IVFs  Medical co-morbidities: Dementia  Impediments to Fracture Healing: Vit D level 6, will start on D2 and D3 supplementation  Dispo: Therapies as tolerated.  PT/OT recommending SNF.  I have signed and placed discharge Rx for pain medication DVT prophylaxis in patient's chart  Follow - up plan: 2 weeks  Contact  information:  Truitt Merle MD, Ulyses Southward PA-C   [facility] Hick[facility] [phone] (office) [email or web address]

## 2020-07-05 NOTE — Plan of Care (Signed)
  Problem: Education: Goal: Verbalization of understanding the information provided (i.e., activity precautions, restrictions, etc) will improve Outcome: Progressing   Problem: Clinical Measurements: Goal: Postoperative complications will be avoided or minimized Outcome: Progressing   Problem: Pain Management: Goal: Pain level will decrease Outcome: Progressing   

## 2020-07-05 NOTE — TOC CAGE-AID Note (Signed)
Transition of Care [facility]) - CAGE-AID Screening   Patient Details  Name: Bunnie Rehberg MRN: [id number] Date of Birth: [birth year]  Transition of Care [facility]) CM/SW Contact:    Jimmy Picket, LCSWA Phone Number: 07/05/2020, 4:35 PM   Clinical Narrative:  Pt unable to participate due to memory impairment.  CAGE-AID Screening: Substance Abuse Screening unable to be completed due to: : Patient unable to participate                   Isabella Stalling Clinical Social Worker [phone]

## 2020-07-06 LAB — BASIC METABOLIC PANEL
Anion gap: 7 (ref 5–15)
BUN: 22 mg/dL (ref 8–23)
CO2: 27 mmol/L (ref 22–32)
Calcium: 8.7 mg/dL — ABNORMAL LOW (ref 8.9–10.3)
Chloride: 102 mmol/L (ref 98–111)
Creatinine, Ser: 1.12 mg/dL — ABNORMAL HIGH (ref 0.44–1.00)
GFR, Estimated: 47 mL/min — ABNORMAL LOW (ref >60)
Glucose, Bld: 129 mg/dL — ABNORMAL HIGH (ref 70–99)
Potassium: 4.4 mmol/L (ref 3.5–5.1)
Sodium: 136 mmol/L (ref 135–145)

## 2020-07-06 NOTE — Progress Notes (Signed)
PROGRESS NOTE    Samantha Hickman  TKZ:[id number] DOB: 04-25-1929 DOA: 07/03/2020 PCP: Patient, No Pcp Per    Brief Narrative:  Samantha Hickman is a 13 female with past medical history remarkable for dementia who presented with unwitnessed fall at home and found to have left hip fracture. She was initially admitted by the orthopedic trauma service and underwent cephalic medullary narrowing of her left intertrochanteric hip fracture on 07/03/2020.  Hospitalist service was consulted by orthopedics to assume care on 07/04/2020.   Assessment & Plan:   Principal Problem:   Displaced intertrochanteric fracture of left femur, initial encounter for closed fracture [facility]) Active Problems:   Dementia without behavioral disturbance ([place])   Mycobacterium avium infection ([place])   Displaced intertrochanteric left femur fracture Patient presenting from home following unwitnessed fall.  Found to have a displaced left intratrochanteric fracture of the left femur.  Patient underwent cephalic medullary nailing by orthopedics, Dr. Doreatha Martin on 07/03/2020. --Pain control with Robaxin, Vicodin, morphine IV as needed --Postoperative DVT prophylaxis with Lovenox --Pending SNF placement  Vitamin D deficiency Vitamin D-25 low at 6.77. --Ergocalciferol 50,000 units oral weekly. --[place] Calciferol 2000 units twice daily  Advanced dementia Patient has been residing at home with periodic caregivers. 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 Valeta Harms.  Treatment is generally several antibiotics for 1-2 years and not well-tolerated.  Given patient's underlying circumstances, advanced dementia no further plan for evaluation or treatment at this time.   DVT prophylaxis: Lovenox Code Status: DNR Family Communication: No family present at bedside this morning  Disposition Plan:  Status is:  Inpatient  Remains inpatient appropriate because:Unsafe d/c plan   Dispo: The patient is from: Home              Anticipated d/c is to: SNF              Anticipated d/c date is: 1 day              Patient currently is medically stable to d/c.   Consultants:   Orthopedics, Dr. Doreatha Martin  Procedures:   Cephalomedullary nailing, Dr. Doreatha Martin 10/27  Antimicrobials:   Perioperative cefazolin   Subjective: Seen and examined bedside, resting comfortably.  Eating breakfast.  No complaints this morning.  Pleasantly confused.  Denies headache, no chest pain, no shortness of breath, no abdominal pain.  Awaiting SNF placement.  No family present this morning.  No acute concerns per nursing staff.  Objective: Vitals:   07/05/20 0500 07/05/20 1930 07/06/20 0603 07/06/20 0750  BP: (!) 139/54 (!) 150/65 121/63 118/86  Pulse: 99 (!) 103 88 91  Resp: _0 Temp: 97.7 F (36.5 C) 98.4 F (36.9 C) 98.2 F (36.8 C) 99.4 F (37.4 C)  TempSrc: Oral Oral Oral Oral  SpO2: 96% 91% 90% 91%  Height:       No intake or output data in the 24 hours ending 07/06/20 1306 There were no vitals filed for this visit.  Examination:  General exam: Appears calm and comfortable, pleasantly confused Respiratory system: Clear to auscultation. Respiratory effort normal.  Doing well on room air Cardiovascular system: S1 & S2 heard, RRR. No JVD, murmurs, rubs, gallops or clicks. No pedal edema. Gastrointestinal system: Abdomen is nondistended, soft and nontender. No organomegaly or masses felt. Normal bowel sounds heard. Central nervous system: Alert, not oriented to person/place/time/situation. No focal neurological deficits. Extremities: Symmetric  5 x 5 power. Skin: No rashes, lesions or ulcers, surgical site noted with dressing in place, clean/dry/intact Psychiatry: Judgement and insight appear normal. Mood & affect appropriate.     Data Reviewed: I have personally reviewed following labs and  imaging studies  CBC: Recent Labs  Lab 07/03/20 1159 07/04/20 0943 07/05/20 0451  WBC 8.7 13.1* 11.6*  NEUTROABS 7.1  --   --   HGB 12.7 11.2* 9.6*  HCT 42.2 36.2 31.0*  MCV 90.9 88.5 89.6  PLT 234 255 300   Basic Metabolic Panel: Recent Labs  Lab 07/03/20 1159 07/04/20 0943 07/05/20 0451 07/06/20 0333  NA 137 136 135 136  K 4.4 4.5 4.7 4.4  CL 103 103 104 102  CO2 27 21* 26 27  GLUCOSE 141* 151* 140* 129*  BUN 14 14 24* 22  CREATININE 0.86 1.18* 1.15* 1.12*  CALCIUM 9.0 8.6* 8.5* 8.7*   GFR: CrCl cannot be calculated (Unknown ideal weight.). Liver Function Tests: No results for input(s): AST, ALT, ALKPHOS, BILITOT, PROT, ALBUMIN in the last 168 hours. No results for input(s): LIPASE, AMYLASE in the last 168 hours. No results for input(s): AMMONIA in the last 168 hours. Coagulation Profile: No results for input(s): INR, PROTIME in the last 168 hours. Cardiac Enzymes: No results for input(s): CKTOTAL, CKMB, CKMBINDEX, TROPONINI in the last 168 hours. BNP (last 3 results) No results for input(s): PROBNP in the last 8760 hours. HbA1C: No results for input(s): HGBA1C in the last 72 hours. CBG: No results for input(s): GLUCAP in the last 168 hours. Lipid Profile: No results for input(s): CHOL, HDL, LDLCALC, TRIG, CHOLHDL, LDLDIRECT in the last 72 hours. Thyroid Function Tests: No results for input(s): TSH, T4TOTAL, FREET4, T3FREE, THYROIDAB in the last 72 hours. Anemia Panel: No results for input(s): VITAMINB12, FOLATE, FERRITIN, TIBC, IRON, RETICCTPCT in the last 72 hours. Sepsis Labs: No results for input(s): PROCALCITON, LATICACIDVEN in the last 168 hours.  Recent Results (from the past 240 hour(s))  Respiratory Panel by RT PCR (Flu A&B, Covid) - Nasopharyngeal Swab     Status: None   Collection Time: 07/03/20 12:04 PM   Specimen: Nasopharyngeal Swab  Result Value Ref Range Status   SARS Coronavirus 2 by RT PCR NEGATIVE NEGATIVE Final    Comment:  (NOTE) SARS-CoV-2 target nucleic acids are NOT DETECTED.  The SARS-CoV-2 RNA is generally detectable in upper respiratoy specimens during the acute phase of infection. The lowest concentration of SARS-CoV-2 viral copies this assay can detect is 131 copies/mL. A negative result does not preclude SARS-Cov-2 infection and should not be used as the sole basis for treatment or other patient management decisions. A negative result may occur with  improper specimen collection/handling, submission of specimen other than nasopharyngeal swab, presence of viral mutation(s) within the areas targeted by this assay, and inadequate number of viral copies (<131 copies/mL). A negative result must be combined with clinical observations, patient history, and epidemiological information. The expected result is Negative.  Fact Sheet for Patients:  PinkCheek.be  Fact Sheet for Healthcare Providers:  GravelBags.it  This test is no t yet approved or cleared by the [place] FDA and  has been authorized for detection and/or diagnosis of SARS-CoV-2 by FDA under an Emergency Use Authorization (EUA). This EUA will remain  in effect (meaning this test can be used) for the duration of the COVID-19 declaration under Section 564(b)(1) of the Act, 21 U.S.C. section 360bbb-3(b)(1), unless the authorization is terminated or revoked sooner.  Influenza A by PCR NEGATIVE NEGATIVE Final   Influenza B by PCR NEGATIVE NEGATIVE Final    Comment: (NOTE) The Xpert Xpress SARS-CoV-2/FLU/RSV assay is intended as an aid in  the diagnosis of influenza from Nasopharyngeal swab specimens and  should not be used as a sole basis for treatment. Nasal washings and  aspirates are unacceptable for Xpert Xpress SARS-CoV-2/FLU/RSV  testing.  Fact Sheet for Patients: PinkCheek.be  Fact Sheet for Healthcare  Providers: GravelBags.it  This test is not yet approved or cleared by the [place] FDA and  has been authorized for detection and/or diagnosis of SARS-CoV-2 by  FDA under an Emergency Use Authorization (EUA). This EUA will remain  in effect (meaning this test can be used) for the duration of the  Covid-19 declaration under Section 564(b)(1) of the Act, 21  U.S.C. section 360bbb-3(b)(1), unless the authorization is  terminated or revoked. Performed at [facility], [place] [address], [place], [place] [address]   SARS Coronavirus 2 by RT PCR (hospital order, performed in [facility] hospital lab) Nasopharyngeal Nasopharyngeal Swab     Status: None   Collection Time: 07/05/20 12:21 PM   Specimen: Nasopharyngeal Swab  Result Value Ref Range Status   SARS Coronavirus 2 NEGATIVE NEGATIVE Final    Comment: (NOTE) SARS-CoV-2 target nucleic acids are NOT DETECTED.  The SARS-CoV-2 RNA is generally detectable in upper and lower respiratory specimens during the acute phase of infection. The lowest concentration of SARS-CoV-2 viral copies this assay can detect is 250 copies / mL. A negative result does not preclude SARS-CoV-2 infection and should not be used as the sole basis for treatment or other patient management decisions.  A negative result may occur with improper specimen collection / handling, submission of specimen other than nasopharyngeal swab, presence of viral mutation(s) within the areas targeted by this assay, and inadequate number of viral copies (<250 copies / mL). A negative result must be combined with clinical observations, patient history, and epidemiological information.  Fact Sheet for Patients:   StrictlyIdeas.no  Fact Sheet for Healthcare Providers: BankingDealers.co.za  This test is not yet approved or  cleared by the [place] FDA and has been authorized for detection and/or  diagnosis of SARS-CoV-2 by FDA under an Emergency Use Authorization (EUA).  This EUA will remain in effect (meaning this test can be used) for the duration of the COVID-19 declaration under Section 564(b)(1) of the Act, 21 U.S.C. section 360bbb-3(b)(1), unless the authorization is terminated or revoked sooner.  Performed at [facility], [place] [address] [place], [place] [address]          Radiology Studies: No results found.    Scheduled Meds: . cholecalciferol  2,000 Units Oral BID  . docusate sodium  100 mg Oral BID  . enoxaparin (LOVENOX) injection  40 mg Subcutaneous Q24H  . feeding supplement  237 mL Oral BID BM  . furosemide  20 mg Oral Daily  . multivitamin with minerals  1 tablet Oral Daily  . Vitamin D (Ergocalciferol)  50,000 Units Oral Q7 days   Continuous Infusions: . methocarbamol (ROBAXIN) IV       LOS: 3 days    Time spent: 32 minutes spent on chart review, discussion with nursing staff, consultants, updating family and interview/physical exam; more than 50% of that time was spent in counseling and/or coordination of care.    Brallan Denio J [place] DO [facility] Available via Epic secure chat 7am-7pm After these hours, please refer  to coverage provider listed on [email or web address] 07/06/2020, 1:06 PM

## 2020-07-06 NOTE — TOC Progression Note (Addendum)
Transition of Care [facility]) - Progression Note    Patient Details  Name: Samantha Hickman MRN: [id number] Date of Birth: 01-29-1929  [facility]) CM/SW Contact  Lorri Frederick, LCSW Phone Number: 07/06/2020, 11:53 AM  Clinical Narrative: PHone calls made to all three children listed on face sheet.  None answered, LM for all three.    1200: TC call from son Ortencia Kick.  CSW shared bed offers.  He asked that CSW check with [facility]--TC message left with French Ana at [employer]. Ortencia Kick will discuss bed offers with siblings.  1300: CSW received call from daughter Albin Felling.  She is on her way to hospital, wants to research the options and will call back. 1500: CSW called daughter, no answer.  1600: phone call from daughter Albin Felling.  First choice remains [facility] but if that is not an option, they will choose [facility].   1610: spoke to [facility], they won't have any bed availability until sometime next week.      Expected Discharge Plan: Skilled Nursing Facility Barriers to Discharge: No SNF bed  Expected Discharge Plan and Services Expected Discharge Plan: Skilled Nursing Facility   Discharge Planning Services: CM Consult   Living arrangements for the past 2 months: Single Family Home                                       Social Determinants of Health (SDOH) Interventions    Readmission Risk Interventions No flowsheet data found.

## 2020-07-07 NOTE — Plan of Care (Signed)
Patient is not in any pain at this time. Plan for discharge is SNF placement - family wants either [facility] or [facility]. No prns given at this time and patient is very pleasant. No apparent distress or needs voiced. SCDs, bed alarm, and fall mats in place. Dsg to left leg C/D/I. Will continue to monitor and continue current POC.

## 2020-07-07 NOTE — Plan of Care (Signed)
No acute changes since the previous night that I took care of her. Still waiting on SNF placement. Will continue to monitor and continue current POC.

## 2020-07-07 NOTE — Progress Notes (Signed)
PROGRESS NOTE    Samantha Hickman  UMP:[id number] DOB: 16-Apr-1929 DOA: 07/03/2020 PCP: Patient, No Pcp Per    Brief Narrative:  Samantha Hickman is a 13 female with past medical history remarkable for dementia who presented with unwitnessed fall at home and found to have left hip fracture. She was initially admitted by the orthopedic trauma service and underwent cephalic medullary narrowing of her left intertrochanteric hip fracture on 07/03/2020.  Hospitalist service was consulted by orthopedics to assume care on 07/04/2020.   Assessment & Plan:   Principal Problem:   Displaced intertrochanteric fracture of left femur, initial encounter for closed fracture [facility]) Active Problems:   Dementia without behavioral disturbance ([place])   Mycobacterium avium infection ([place])   Displaced intertrochanteric left femur fracture Patient presenting from home following unwitnessed fall.  Found to have a displaced left intratrochanteric fracture of the left femur.  Patient underwent cephalic medullary nailing by orthopedics, Dr. Doreatha Martin on 07/03/2020. --Pain control with Robaxin, Vicodin, morphine IV as needed --Postoperative DVT prophylaxis with Lovenox --Pending SNF placement, family prefers [facility], TOC for coordination  Vitamin D deficiency Vitamin D-25 low at 6.77. --Ergocalciferol 50,000 units oral weekly. --Colecalciferol 2000 units twice daily  Advanced dementia Patient has been residing at home with periodic caregivers. 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 Valeta Harms.  Treatment is generally several antibiotics for 1-2 years and not well-tolerated.  Given patient's underlying circumstances, advanced dementia no further plan for evaluation or treatment at this time.   DVT prophylaxis: Lovenox Code Status: DNR Family Communication: No family present at bedside  this morning  Disposition Plan:  Status is: Inpatient  Remains inpatient appropriate because:Unsafe d/c plan   Dispo: The patient is from: Home              Anticipated d/c is to: SNF              Anticipated d/c date is: 1 day              Patient currently is medically stable to d/c.   Consultants:   Orthopedics, Dr. Doreatha Martin  Procedures:   Cephalomedullary nailing, Dr. Doreatha Martin 10/27  Antimicrobials:   Perioperative cefazolin   Subjective: Seen and examined bedside, resting comfortably.  No complaints this morning.  Pleasantly confused.  Denies headache, no chest pain, no shortness of breath, no abdominal pain.  Awaiting SNF placement.  No family present this morning.  No acute concerns per nursing staff.  Objective: Vitals:   07/06/20 1458 07/06/20 2037 07/07/20 0355 07/07/20 0852  BP: (!) 133/55 (!) 141/59 136/62 (!) 146/60  Pulse: 98 99 93 91  Resp: _0 Temp: 98.8 F (37.1 C) 98.1 F (36.7 C) 98.3 F (36.8 C) 99 F (37.2 C)  TempSrc: Oral Oral Oral Oral  SpO2: 94% 90% 91% 91%  Height:        Intake/Output Summary (Last 24 hours) at 07/07/2020 1120 Last data filed at 07/07/2020 0900 Gross per 24 hour  Intake 480 ml  Output 2400 ml  Net -1920 ml   There were no vitals filed for this visit.  Examination:  General exam: Appears calm and comfortable, pleasantly confused Respiratory system: Clear to auscultation. Respiratory effort normal.  Doing well on room air Cardiovascular system: S1 & S2 heard, RRR. No JVD, murmurs, rubs, gallops or clicks. No pedal edema. Gastrointestinal system: Abdomen is nondistended, soft  and nontender. No organomegaly or masses felt. Normal bowel sounds heard. Central nervous system: Alert, not oriented to person/place/time/situation. No focal neurological deficits. Extremities: Symmetric 5 x 5 power. Skin: No rashes, lesions or ulcers, surgical site noted with dressing in place, clean/dry/intact Psychiatry: Judgement  and insight appear normal. Mood & affect appropriate.     Data Reviewed: I have personally reviewed following labs and imaging studies  CBC: Recent Labs  Lab 07/03/20 1159 07/04/20 0943 07/05/20 0451  WBC 8.7 13.1* 11.6*  NEUTROABS 7.1  --   --   HGB 12.7 11.2* 9.6*  HCT 42.2 36.2 31.0*  MCV 90.9 88.5 89.6  PLT 234 255 102   Basic Metabolic Panel: Recent Labs  Lab 07/03/20 1159 07/04/20 0943 07/05/20 0451 07/06/20 0333  NA 137 136 135 136  K 4.4 4.5 4.7 4.4  CL 103 103 104 102  CO2 27 21* 26 27  GLUCOSE 141* 151* 140* 129*  BUN 14 14 24* 22  CREATININE 0.86 1.18* 1.15* 1.12*  CALCIUM 9.0 8.6* 8.5* 8.7*   GFR: CrCl cannot be calculated (Unknown ideal weight.). Liver Function Tests: No results for input(s): AST, ALT, ALKPHOS, BILITOT, PROT, ALBUMIN in the last 168 hours. No results for input(s): LIPASE, AMYLASE in the last 168 hours. No results for input(s): AMMONIA in the last 168 hours. Coagulation Profile: No results for input(s): INR, PROTIME in the last 168 hours. Cardiac Enzymes: No results for input(s): CKTOTAL, CKMB, CKMBINDEX, TROPONINI in the last 168 hours. BNP (last 3 results) No results for input(s): PROBNP in the last 8760 hours. HbA1C: No results for input(s): HGBA1C in the last 72 hours. CBG: No results for input(s): GLUCAP in the last 168 hours. Lipid Profile: No results for input(s): CHOL, HDL, LDLCALC, TRIG, CHOLHDL, LDLDIRECT in the last 72 hours. Thyroid Function Tests: No results for input(s): TSH, T4TOTAL, FREET4, T3FREE, THYROIDAB in the last 72 hours. Anemia Panel: No results for input(s): VITAMINB12, FOLATE, FERRITIN, TIBC, IRON, RETICCTPCT in the last 72 hours. Sepsis Labs: No results for input(s): PROCALCITON, LATICACIDVEN in the last 168 hours.  Recent Results (from the past 240 hour(s))  Respiratory Panel by RT PCR (Flu A&B, Covid) - Nasopharyngeal Swab     Status: None   Collection Time: 07/03/20 12:04 PM   Specimen:  Nasopharyngeal Swab  Result Value Ref Range Status   SARS Coronavirus 2 by RT PCR NEGATIVE NEGATIVE Final    Comment: (NOTE) SARS-CoV-2 target nucleic acids are NOT DETECTED.  The SARS-CoV-2 RNA is generally detectable in upper respiratoy specimens during the acute phase of infection. The lowest concentration of SARS-CoV-2 viral copies this assay can detect is 131 copies/mL. A negative result does not preclude SARS-Cov-2 infection and should not be used as the sole basis for treatment or other patient management decisions. A negative result may occur with  improper specimen collection/handling, submission of specimen other than nasopharyngeal swab, presence of viral mutation(s) within the areas targeted by this assay, and inadequate number of viral copies (<131 copies/mL). A negative result must be combined with clinical observations, patient history, and epidemiological information. The expected result is Negative.  Fact Sheet for Patients:  PinkCheek.be  Fact Sheet for Healthcare Providers:  GravelBags.it  This test is no t yet approved or cleared by the [place] FDA and  has been authorized for detection and/or diagnosis of SARS-CoV-2 by FDA under an Emergency Use Authorization (EUA). This EUA will remain  in effect (meaning this test can be used) for the duration  of the COVID-19 declaration under Section 564(b)(1) of the Act, 21 U.S.C. section 360bbb-3(b)(1), unless the authorization is terminated or revoked sooner.     Influenza A by PCR NEGATIVE NEGATIVE Final   Influenza B by PCR NEGATIVE NEGATIVE Final    Comment: (NOTE) The Xpert Xpress SARS-CoV-2/FLU/RSV assay is intended as an aid in  the diagnosis of influenza from Nasopharyngeal swab specimens and  should not be used as a sole basis for treatment. Nasal washings and  aspirates are unacceptable for Xpert Xpress SARS-CoV-2/FLU/RSV  testing.  Fact Sheet  for Patients: PinkCheek.be  Fact Sheet for Healthcare Providers: GravelBags.it  This test is not yet approved or cleared by the [place] FDA and  has been authorized for detection and/or diagnosis of SARS-CoV-2 by  FDA under an Emergency Use Authorization (EUA). This EUA will remain  in effect (meaning this test can be used) for the duration of the  Covid-19 declaration under Section 564(b)(1) of the Act, 21  U.S.C. section 360bbb-3(b)(1), unless the authorization is  terminated or revoked. Performed at [facility], [place] [address] [place], [place] [address]   SARS Coronavirus 2 by RT PCR (hospital order, performed in [facility] hospital lab) Nasopharyngeal Nasopharyngeal Swab     Status: None   Collection Time: 07/05/20 12:21 PM   Specimen: Nasopharyngeal Swab  Result Value Ref Range Status   SARS Coronavirus 2 NEGATIVE NEGATIVE Final    Comment: (NOTE) SARS-CoV-2 target nucleic acids are NOT DETECTED.  The SARS-CoV-2 RNA is generally detectable in upper and lower respiratory specimens during the acute phase of infection. The lowest concentration of SARS-CoV-2 viral copies this assay can detect is 250 copies / mL. A negative result does not preclude SARS-CoV-2 infection and should not be used as the sole basis for treatment or other patient management decisions.  A negative result may occur with improper specimen collection / handling, submission of specimen other than nasopharyngeal swab, presence of viral mutation(s) within the areas targeted by this assay, and inadequate number of viral copies (<250 copies / mL). A negative result must be combined with clinical observations, patient history, and epidemiological information.  Fact Sheet for Patients:   StrictlyIdeas.no  Fact Sheet for Healthcare Providers: BankingDealers.co.za  This test is not yet approved  or  cleared by the [place] FDA and has been authorized for detection and/or diagnosis of SARS-CoV-2 by FDA under an Emergency Use Authorization (EUA).  This EUA will remain in effect (meaning this test can be used) for the duration of the COVID-19 declaration under Section 564(b)(1) of the Act, 21 U.S.C. section 360bbb-3(b)(1), unless the authorization is terminated or revoked sooner.  Performed at [facility], [place] [address] [place], [place] [address]          Radiology Studies: No results found.    Scheduled Meds: . cholecalciferol  2,000 Units Oral BID  . docusate sodium  100 mg Oral BID  . enoxaparin (LOVENOX) injection  40 mg Subcutaneous Q24H  . feeding supplement  237 mL Oral BID BM  . furosemide  20 mg Oral Daily  . multivitamin with minerals  1 tablet Oral Daily  . Vitamin D (Ergocalciferol)  50,000 Units Oral Q7 days   Continuous Infusions: . methocarbamol (ROBAXIN) IV       LOS: 4 days    Time spent: 32 minutes spent on chart review, discussion with nursing staff, consultants, updating family and interview/physical exam; more than 50% of that time was spent  in counseling and/or coordination of care.    Manuella Blackson J [place] DO [facility] Available via Epic secure chat 7am-7pm After these hours, please refer to coverage provider listed on [email or web address] 07/07/2020, 11:20 AM

## 2020-07-08 MED ORDER — ADULT MULTIVITAMIN W/MINERALS CH: 1.0000 | ORAL_TABLET | Freq: Every day | ORAL | Status: AC

## 2020-07-08 MED ORDER — DOCUSATE SODIUM 100 MG PO CAPS: 100.0000 mg | ORAL_CAPSULE | Freq: Two times a day (BID) | ORAL | 0 refills | Status: AC

## 2020-07-08 NOTE — Progress Notes (Signed)
Patient discharged via stretcher w/ [facility]  to [facility]

## 2020-07-08 NOTE — TOC Transition Note (Signed)
Transition of Care [facility]) - CM/SW Discharge Note   Patient Details  Name: Samantha Hickman MRN: [id number] Date of Birth: [birth year]  Transition of Care [facility]) CM/SW Contact:  Jimmy Picket, LCSWA Phone Number: 07/08/2020, 2:16 PM   Clinical Narrative:     Pt will discharge to [facility] via ptar. PTs son Gery Pray was notified, csw also left message for daughter Albin Felling. Ptar has been called.  Nurse to call report to [phone]  Final next level of care: Skilled Nursing Facility Barriers to Discharge: Barriers Resolved   Patient Goals and CMS Choice   CMS Medicare.gov Compare Post Acute Care list provided to:: Patient Choice offered to / list presented to : Adult Children Gery Pray)  Discharge Placement              Patient [facility] bed at: [facility] Patient to be transferred to facility by: ptar Name of family member notified: Albin Felling, daughter Patient and family notified of of transfer: 07/08/20  Discharge Plan and Services   Discharge Planning Services: CM Consult                                 Social Determinants of Health (SDOH) Interventions     Readmission Risk Interventions No flowsheet data found.  Jimmy Picket, Theresia Majors, [place] Clinical Social Worker [phone]

## 2020-07-08 NOTE — Progress Notes (Signed)
PROGRESS NOTE    Samantha Hickman  IZT:[id number] DOB: [birth year] DOA: 07/03/2020 PCP: Patient, No Pcp Per    Brief Narrative:  Samantha Hickman is a 34 female with past medical history remarkable for dementia who presented with unwitnessed fall at home and found to have left hip fracture. She was initially admitted by the orthopedic trauma service and underwent cephalic medullary narrowing of her left intertrochanteric hip fracture on 07/03/2020.  Hospitalist service was consulted by orthopedics to assume care on 07/04/2020.   Assessment & Plan:   Principal Problem:   Displaced intertrochanteric fracture of left femur, initial encounter for closed fracture [facility]) Active Problems:   Dementia without behavioral disturbance ([place])   Mycobacterium avium infection ([place])   Displaced intertrochanteric left femur fracture Patient presenting from home following unwitnessed fall.  Found to have a displaced left intratrochanteric fracture of the left femur.  Patient underwent cephalic medullary nailing by orthopedics, Dr. Doreatha Martin on 07/03/2020. --Pain control with Robaxin, Vicodin, morphine IV as needed --Postoperative DVT prophylaxis with Lovenox --Pending SNF placement, family prefers [facility], TOC for coordination  Vitamin D deficiency Vitamin D-25 low at 6.77. --Ergocalciferol 50,000 units oral weekly. --Colecalciferol 2000 units twice daily  Advanced dementia Patient has been residing at home with periodic caregivers. 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 Valeta Harms.  Treatment is generally several antibiotics for 1-2 years and not well-tolerated.  Given patient's underlying circumstances, advanced dementia no further plan for evaluation or treatment at this time.   DVT prophylaxis: Lovenox Code Status: DNR Family Communication: No family present at bedside  this morning  Disposition Plan:  Status is: Inpatient  Remains inpatient appropriate because:Unsafe d/c plan   Dispo: The patient is from: Home              Anticipated d/c is to: SNF              Anticipated d/c date is: 1 day              Patient currently is medically stable to d/c.   Consultants:   Orthopedics, Dr. Doreatha Martin  Procedures:   Cephalomedullary nailing, Dr. Doreatha Martin 10/27  Antimicrobials:   Perioperative cefazolin   Subjective: Seen and examined bedside, resting comfortably.  Sleeping but easily arousable.  No complaints this morning.  Pleasantly confused.  Denies headache, no chest pain, no shortness of breath, no abdominal pain.  Awaiting SNF placement.  No family present this morning.  No acute concerns per nursing staff.  Objective: Vitals:   07/07/20 2031 07/08/20 0341 07/08/20 0743 07/08/20 0856  BP: (!) 120/56 (!) 126/53 (!) 115/46 (!) 144/78  Pulse: 98 91 81   Resp: _0 Temp: 99.8 F (37.7 C) 98 F (36.7 C) 98.2 F (36.8 C)   TempSrc: Oral Oral Oral   SpO2: 93% 94% 92%   Height:        Intake/Output Summary (Last 24 hours) at 07/08/2020 1049 Last data filed at 07/07/2020 1323 Gross per 24 hour  Intake 480 ml  Output --  Net 480 ml   There were no vitals filed for this visit.  Examination:  General exam: Appears calm and comfortable, pleasantly confused Respiratory system: Clear to auscultation. Respiratory effort normal.  Oxygenating well on room air Cardiovascular system: S1 & S2 heard, RRR. No JVD, murmurs, rubs, gallops or clicks. No pedal edema. Gastrointestinal system: Abdomen is  nondistended, soft and nontender. No organomegaly or masses felt. Normal bowel sounds heard. Central nervous system: Alert, not oriented to person/place/time/situation. No focal neurological deficits. Extremities: Symmetric 5 x 5 power. Skin: No rashes, lesions or ulcers, surgical site noted with dressing in place, clean/dry/intact Psychiatry:  Judgement and insight appear normal. Mood & affect appropriate.     Data Reviewed: I have personally reviewed following labs and imaging studies  CBC: Recent Labs  Lab 07/03/20 1159 07/04/20 0943 07/05/20 0451  WBC 8.7 13.1* 11.6*  NEUTROABS 7.1  --   --   HGB 12.7 11.2* 9.6*  HCT 42.2 36.2 31.0*  MCV 90.9 88.5 89.6  PLT 234 255 938   Basic Metabolic Panel: Recent Labs  Lab 07/03/20 1159 07/04/20 0943 07/05/20 0451 07/06/20 0333  NA 137 136 135 136  K 4.4 4.5 4.7 4.4  CL 103 103 104 102  CO2 27 21* 26 27  GLUCOSE 141* 151* 140* 129*  BUN 14 14 24* 22  CREATININE 0.86 1.18* 1.15* 1.12*  CALCIUM 9.0 8.6* 8.5* 8.7*   GFR: CrCl cannot be calculated (Unknown ideal weight.). Liver Function Tests: No results for input(s): AST, ALT, ALKPHOS, BILITOT, PROT, ALBUMIN in the last 168 hours. No results for input(s): LIPASE, AMYLASE in the last 168 hours. No results for input(s): AMMONIA in the last 168 hours. Coagulation Profile: No results for input(s): INR, PROTIME in the last 168 hours. Cardiac Enzymes: No results for input(s): CKTOTAL, CKMB, CKMBINDEX, TROPONINI in the last 168 hours. BNP (last 3 results) No results for input(s): PROBNP in the last 8760 hours. HbA1C: No results for input(s): HGBA1C in the last 72 hours. CBG: No results for input(s): GLUCAP in the last 168 hours. Lipid Profile: No results for input(s): CHOL, HDL, LDLCALC, TRIG, CHOLHDL, LDLDIRECT in the last 72 hours. Thyroid Function Tests: No results for input(s): TSH, T4TOTAL, FREET4, T3FREE, THYROIDAB in the last 72 hours. Anemia Panel: No results for input(s): VITAMINB12, FOLATE, FERRITIN, TIBC, IRON, RETICCTPCT in the last 72 hours. Sepsis Labs: No results for input(s): PROCALCITON, LATICACIDVEN in the last 168 hours.  Recent Results (from the past 240 hour(s))  Respiratory Panel by RT PCR (Flu A&B, Covid) - Nasopharyngeal Swab     Status: None   Collection Time: 07/03/20 12:04 PM    Specimen: Nasopharyngeal Swab  Result Value Ref Range Status   SARS Coronavirus 2 by RT PCR NEGATIVE NEGATIVE Final    Comment: (NOTE) SARS-CoV-2 target nucleic acids are NOT DETECTED.  The SARS-CoV-2 RNA is generally detectable in upper respiratoy specimens during the acute phase of infection. The lowest concentration of SARS-CoV-2 viral copies this assay can detect is 131 copies/mL. A negative result does not preclude SARS-Cov-2 infection and should not be used as the sole basis for treatment or other patient management decisions. A negative result may occur with  improper specimen collection/handling, submission of specimen other than nasopharyngeal swab, presence of viral mutation(s) within the areas targeted by this assay, and inadequate number of viral copies (<131 copies/mL). A negative result must be combined with clinical observations, patient history, and epidemiological information. The expected result is Negative.  Fact Sheet for Patients:  PinkCheek.be  Fact Sheet for Healthcare Providers:  GravelBags.it  This test is no t yet approved or cleared by the [place] FDA and  has been authorized for detection and/or diagnosis of SARS-CoV-2 by FDA under an Emergency Use Authorization (EUA). This EUA will remain  in effect (meaning this test can be used) for  the duration of the COVID-19 declaration under Section 564(b)(1) of the Act, 21 U.S.C. section 360bbb-3(b)(1), unless the authorization is terminated or revoked sooner.     Influenza A by PCR NEGATIVE NEGATIVE Final   Influenza B by PCR NEGATIVE NEGATIVE Final    Comment: (NOTE) The Xpert Xpress SARS-CoV-2/FLU/RSV assay is intended as an aid in  the diagnosis of influenza from Nasopharyngeal swab specimens and  should not be used as a sole basis for treatment. Nasal washings and  aspirates are unacceptable for Xpert Xpress SARS-CoV-2/FLU/RSV   testing.  Fact Sheet for Patients: PinkCheek.be  Fact Sheet for Healthcare Providers: GravelBags.it  This test is not yet approved or cleared by the [place] FDA and  has been authorized for detection and/or diagnosis of SARS-CoV-2 by  FDA under an Emergency Use Authorization (EUA). This EUA will remain  in effect (meaning this test can be used) for the duration of the  Covid-19 declaration under Section 564(b)(1) of the Act, 21  U.S.C. section 360bbb-3(b)(1), unless the authorization is  terminated or revoked. Performed at [facility], [place] [address], [place], [place] [address]   SARS Coronavirus 2 by RT PCR (hospital order, performed in [facility] hospital lab) Nasopharyngeal Nasopharyngeal Swab     Status: None   Collection Time: 07/05/20 12:21 PM   Specimen: Nasopharyngeal Swab  Result Value Ref Range Status   SARS Coronavirus 2 NEGATIVE NEGATIVE Final    Comment: (NOTE) SARS-CoV-2 target nucleic acids are NOT DETECTED.  The SARS-CoV-2 RNA is generally detectable in upper and lower respiratory specimens during the acute phase of infection. The lowest concentration of SARS-CoV-2 viral copies this assay can detect is 250 copies / mL. A negative result does not preclude SARS-CoV-2 infection and should not be used as the sole basis for treatment or other patient management decisions.  A negative result may occur with improper specimen collection / handling, submission of specimen other than nasopharyngeal swab, presence of viral mutation(s) within the areas targeted by this assay, and inadequate number of viral copies (<250 copies / mL). A negative result must be combined with clinical observations, patient history, and epidemiological information.  Fact Sheet for Patients:   StrictlyIdeas.no  Fact Sheet for Healthcare Providers: BankingDealers.co.za  This  test is not yet approved or  cleared by the [place] FDA and has been authorized for detection and/or diagnosis of SARS-CoV-2 by FDA under an Emergency Use Authorization (EUA).  This EUA will remain in effect (meaning this test can be used) for the duration of the COVID-19 declaration under Section 564(b)(1) of the Act, 21 U.S.C. section 360bbb-3(b)(1), unless the authorization is terminated or revoked sooner.  Performed at [facility], [place] [address] [place], [place] [address]          Radiology Studies: No results found.    Scheduled Meds: . cholecalciferol  2,000 Units Oral BID  . docusate sodium  100 mg Oral BID  . enoxaparin (LOVENOX) injection  40 mg Subcutaneous Q24H  . feeding supplement  237 mL Oral BID BM  . furosemide  20 mg Oral Daily  . multivitamin with minerals  1 tablet Oral Daily  . Vitamin D (Ergocalciferol)  50,000 Units Oral Q7 days   Continuous Infusions: . methocarbamol (ROBAXIN) IV       LOS: 5 days    Time spent: 32 minutes spent on chart review, discussion with nursing staff, consultants, updating family and interview/physical exam; more than 50% of that time  was spent in counseling and/or coordination of care.    Pualani Borah J [place] DO [facility] Available via Epic secure chat 7am-7pm After these hours, please refer to coverage provider listed on [email or web address] 07/08/2020, 10:49 AM

## 2020-07-08 NOTE — Progress Notes (Signed)
Report called to receiving facility, all questions answered. Pt IV removed, belongings gathered, and PTAR called for transportation. Will continue to monitor until discharge.

## 2020-07-08 NOTE — Plan of Care (Signed)
  Problem: Education: Goal: Verbalization of understanding the information provided (i.e., activity precautions, restrictions, etc) will improve Outcome: Progressing Goal: Individualized Educational Video(s) Outcome: Progressing   Problem: Self-Concept: Goal: Ability to maintain and perform role responsibilities to the fullest extent possible will improve Outcome: Progressing

## 2020-07-08 NOTE — Care Management Important Message (Signed)
Important Message  Patient Details  Name: Samantha Hickman MRN: [id number] Date of Birth: [birth year]   Medicare Important Message Given:  Yes - Important Message mailed due to current National Emergency  Verbal consent obtained due to current National Emergency  Relationship to patient: Self Contact Name: Demani Weyrauch Call Date: 07/08/20  Time: 1142 Phone: [phone] Outcome: No Answer/Busy Important Message mailed to: Patient address on file    Orson Aloe 07/08/2020, 11:42 AM

## 2020-07-08 NOTE — Discharge Summary (Signed)
Physician Discharge Summary  Samantha Hickman POE:[id number] DOB: Jan 30, 1929 DOA: 07/03/2020  PCP: Patient, No Pcp Per  Admit date: 07/03/2020 Discharge date: 07/08/2020  Admitted From: Home Disposition: [facility]  Recommendations for Outpatient Follow-up:  1. Follow up with physician at facility in 2-3 days of admission 2. Follow-up with Dr. Doreatha Martin, orthopedics 2 weeks for postoperative check 3. Continue Lovenox for DVT prophylaxis per orthopedics  Discharge Condition: Stable CODE STATUS: DNR Diet recommendation: Regular diet  History of present illness:  Samantha Hickman is a 7 female with past medical history remarkable for dementia who presented with unwitnessed fall at home and found to have left hip fracture. She was initially admitted by the orthopedic trauma service and underwent cephalic medullary narrowing of her left intertrochanteric hip fracture on 07/03/2020.  Hospitalist service was consulted by orthopedics to assume care on 07/04/2020.  Hospital course:  Displaced intertrochanteric left femur fracture Patient presenting from home following unwitnessed fall.  Found to have a displaced left intratrochanteric fracture of the left femur.  Patient underwent cephalic medullary nailing by orthopedics, Dr. Doreatha Martin on 07/03/2020.  Continue pain control with hydrocodone-acetaminophen 5-325 mg every 6 hours as needed, Robaxin for muscle spasms.  Continue postoperative DVT prophylaxis with Lovenox.  Outpatient follow-up with orthopedics, Dr. Doreatha Martin in 2 weeks.  Discharging to [facility] for further rehabilitation.  Vitamin D deficiency Vitamin D-25 low at 6.77.  Started on ergocalciferol 50,000 units oral weekly and vitamin D3 2000 units twice daily  Advanced dementia Patient has been residing at home with periodic caregivers.  Discharging to SNF for short-term rehabilitation, will likely need long-term care following.  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 Valeta Harms.  Treatment is generally several antibiotics for 1-2 years and not well-tolerated.  Given patient's underlying circumstances, advanced dementia no further plan for evaluation or treatment at this time.  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   enoxaparin 40 MG/0.4ML injection Commonly known as: LOVENOX Inject 0.4 mLs (40 mg total) into the skin daily.   furosemide 20 MG tablet Commonly known as: LASIX Take 20 mg by mouth daily.   HYDROcodone-acetaminophen 5-325 MG tablet Commonly known as: NORCO/VICODIN Take 1 tablet by mouth every 6 (six) hours as needed for severe pain.   methocarbamol 500 MG tablet Commonly known as: ROBAXIN Take 1 tablet (500 mg total) by mouth every 6 (six) hours as needed for muscle spasms.   multivitamin with minerals Tabs tablet Take 1 tablet by mouth daily. Start taking on: July 09, 2020   Vitamin D (Ergocalciferol) 1.25 MG (50000 UNIT) Caps capsule Commonly known as: DRISDOL Take 1 capsule (50,000 Units total) by mouth every 7 (seven) days.   Vitamin D3 25 MCG tablet Commonly known as: Vitamin D  Take 2 tablets (2,000 Units total) by mouth 2 (two) times daily.       Follow-up Information    Haddix, Thomasene Lot, MD. Schedule an appointment as soon as  possible for a visit in 2 week(s).   Specialty: Orthopedic Surgery Contact information: [place] [address] [phone]              No Known Allergies  Consultations:  Orthopedics, Dr. Doreatha Martin   Procedures/Studies: DG Chest 1 View  Result Date: 07/03/2020 CLINICAL DATA:  Pain following fall EXAM: CHEST  1 VIEW COMPARISON:  September 05, 2017 chest radiograph; chest CT October 13, 2017 FINDINGS: There is diffuse interstitial thickening with scattered subcentimeter nodular opacities, also present on prior CT examination. Underlying areas of bronchiectatic change noted in the lower lobe regions. No edema or airspace opacity. Heart is upper normal in size with pulmonary vascularity normal. There is aortic atherosclerosis. Bones are osteoporotic. IMPRESSION: Interstitial thickening with scattered subcentimeter nodular lesions, also seen previously. Underlying bronchiectatic change. There is likely a degree of chronic bronchitis. The appearance on previous CT is felt to be indicative of atypical infectious lesion such as Mycobacterium avium intracellulare. No frank edema or consolidation. Stable cardiac silhouette. Aortic Atherosclerosis (ICD10-I70.0). Electronically Signed   By: Lowella Grip III M.D.   On: 07/03/2020 12:53   DG C-Arm 1-60 Min  Result Date: 07/03/2020 CLINICAL DATA:  Open reduction internal fixation. EXAM: OPERATIVE LEFT HIP (WITH PELVIS IF PERFORMED) 6 VIEWS TECHNIQUE: Fluoroscopic spot image(s) were submitted for interpretation post-operatively. COMPARISON:  Hip radiographs 07/03/2020 FINDINGS: Fluoroscopic time: 1 minutes and 28 seconds. Six C-arm fluoroscopic images were obtained intraoperatively and submitted for post operative interpretation. These images demonstrate cephalomedullary nail fixation of a left intertrochanteric femur fracture with improved, near anatomic alignment. Please see the performing provider's procedural report for further  detail. IMPRESSION: Intraoperative fluoroscopic imaging, as detailed above. Electronically Signed   By: Margaretha Sheffield MD   On: 07/03/2020 16:38   DG HIP PORT UNILAT W OR W/O PELVIS 1V LEFT  Result Date: 07/03/2020 CLINICAL DATA:  Postop. EXAM: DG HIP (WITH OR WITHOUT PELVIS) 1V PORT LEFT COMPARISON:  Preoperative radiographs earlier this day. FINDINGS: Intramedullary nail with 2 trans trochanteric and single distal locking screw fixation of intertrochanteric femur fracture. The intertrochanteric fracture is in improved alignment compared to preoperative imaging. There is a lucency of the lateral proximal femoral shaft adjacent to the distal locking screw that may abut the femoral stem that was not seen on preoperative radiograph. Bones are diffusely under mineralized. Pubic rami are intact. IMPRESSION: 1. ORIF of intertrochanteric left femur fracture, in improved alignment compared to preoperative imaging. 2. Linear lucency of the lateral proximal femoral shaft adjacent to the distal locking screw, may abut the femoral stem, not seen on preoperative radiograph, suspicious for periprosthetic fracture. Recommend attention on follow-up. Electronically Signed   By: Keith Rake M.D.   On: 07/03/2020 19:03   DG HIP OPERATIVE UNILAT WITH PELVIS LEFT  Result Date: 07/03/2020 CLINICAL DATA:  Open reduction internal fixation. EXAM: OPERATIVE LEFT HIP (WITH PELVIS IF PERFORMED) 6 VIEWS TECHNIQUE: Fluoroscopic spot image(s) were submitted for interpretation post-operatively. COMPARISON:  Hip radiographs 07/03/2020 FINDINGS: Fluoroscopic time: 1 minutes and 28 seconds. Six C-arm fluoroscopic images were obtained intraoperatively and submitted for post operative interpretation. These images demonstrate cephalomedullary nail fixation of a left intertrochanteric femur fracture with improved, near anatomic alignment. Please see the performing provider's procedural report for further detail. IMPRESSION:  Intraoperative  fluoroscopic imaging, as detailed above. Electronically Signed   By: Margaretha Sheffield MD   On: 07/03/2020 16:38   DG Hip Unilat W or Wo Pelvis 2-3 Views Left  Result Date: 07/03/2020 CLINICAL DATA:  Left hip pain EXAM: DG HIP (WITH OR WITHOUT PELVIS) 2-3V LEFT COMPARISON:  None. FINDINGS: Diffuse osseous demineralization. Acute intertrochanteric fracture of the proximal left femur with slight varus angulation. There is a rounded 1.9 cm nonspecific sclerotic area within the intertrochanteric aspect of the femur. No additional sclerotic or lytic bony abnormalities are seen within the left femur or visualized pelvis. IMPRESSION: 1. Acute intertrochanteric fracture of the proximal left femur with slight varus angulation. 2. Nonspecific 1.9 cm sclerotic area within the intertrochanteric aspect of the left femur. Although this appearance could be posttraumatic, an underlying bone lesion is not excluded. Correlation with prior outside imaging, if available, which includes the proximal femur is recommended. Electronically Signed   By: Davina Poke D.O.   On: 07/03/2020 12:53      Subjective: Patient seen and examined bedside, resting comfortably.  Sleeping but easily arousable.  No complaints this morning.  Pleasantly confused.  Denies headache, no chest pain, no shortness of breath, no abdominal pain.  No family present this morning.  No acute concerns per nursing staff.  Discharging to [facility] SNF today.  Discharge Exam: Vitals:   07/08/20 0743 07/08/20 0856  BP: (!) 115/46 (!) 144/78  Pulse: 81   Resp: 17   Temp: 98.2 F (36.8 C)   SpO2: 92%    Vitals:   07/07/20 2031 07/08/20 0341 07/08/20 0743 07/08/20 0856  BP: (!) 120/56 (!) 126/53 (!) 115/46 (!) 144/78  Pulse: 98 91 81   Resp: _0 Temp: 99.8 F (37.7 C) 98 F (36.7 C) 98.2 F (36.8 C)   TempSrc: Oral Oral Oral   SpO2: 93% 94% 92%   Height:        General: Pt is alert, awake, not in acute distress,  pleasantly confused Cardiovascular: RRR, S1/S2 +, no rubs, no gallops Respiratory: CTA bilaterally, no wheezing, no rhonchi Abdominal: Soft, NT, ND, bowel sounds + Extremities: no edema, no cyanosis    The results of significant diagnostics from this hospitalization (including imaging, microbiology, ancillary and laboratory) are listed below for reference.     Microbiology: Recent Results (from the past 240 hour(s))  Respiratory Panel by RT PCR (Flu A&B, Covid) - Nasopharyngeal Swab     Status: None   Collection Time: 07/03/20 12:04 PM   Specimen: Nasopharyngeal Swab  Result Value Ref Range Status   SARS Coronavirus 2 by RT PCR NEGATIVE NEGATIVE Final    Comment: (NOTE) SARS-CoV-2 target nucleic acids are NOT DETECTED.  The SARS-CoV-2 RNA is generally detectable in upper respiratoy specimens during the acute phase of infection. The lowest concentration of SARS-CoV-2 viral copies this assay can detect is 131 copies/mL. A negative result does not preclude SARS-Cov-2 infection and should not be used as the sole basis for treatment or other patient management decisions. A negative result may occur with  improper specimen collection/handling, submission of specimen other than nasopharyngeal swab, presence of viral mutation(s) within the areas targeted by this assay, and inadequate number of viral copies (<131 copies/mL). A negative result must be combined with clinical observations, patient history, and epidemiological information. The expected result is Negative.  Fact Sheet for Patients:  PinkCheek.be  Fact Sheet for Healthcare Providers:  GravelBags.it  This test is no t yet approved or  cleared by the [place] and  has been authorized for detection and/or diagnosis of SARS-CoV-2 by FDA under an Emergency Use Authorization (EUA). This EUA will remain  in effect (meaning this test can be used) for the duration of  the COVID-19 declaration under Section 564(b)(1) of the Act, 21 U.S.C. section 360bbb-3(b)(1), unless the authorization is terminated or revoked sooner.     Influenza A by PCR NEGATIVE NEGATIVE Final   Influenza B by PCR NEGATIVE NEGATIVE Final    Comment: (NOTE) The Xpert Xpress SARS-CoV-2/FLU/RSV assay is intended as an aid in  the diagnosis of influenza from Nasopharyngeal swab specimens and  should not be used as a sole basis for treatment. Nasal washings and  aspirates are unacceptable for Xpert Xpress SARS-CoV-2/FLU/RSV  testing.  Fact Sheet for Patients: PinkCheek.be  Fact Sheet for Healthcare Providers: GravelBags.it  This test is not yet approved or cleared by the [place] FDA and  has been authorized for detection and/or diagnosis of SARS-CoV-2 by  FDA under an Emergency Use Authorization (EUA). This EUA will remain  in effect (meaning this test can be used) for the duration of the  Covid-19 declaration under Section 564(b)(1) of the Act, 21  U.S.C. section 360bbb-3(b)(1), unless the authorization is  terminated or revoked. Performed at [facility], [place] [address] [place], [place] [address]   SARS Coronavirus 2 by RT PCR (hospital order, performed in [facility] hospital lab) Nasopharyngeal Nasopharyngeal Swab     Status: None   Collection Time: 07/05/20 12:21 PM   Specimen: Nasopharyngeal Swab  Result Value Ref Range Status   SARS Coronavirus 2 NEGATIVE NEGATIVE Final    Comment: (NOTE) SARS-CoV-2 target nucleic acids are NOT DETECTED.  The SARS-CoV-2 RNA is generally detectable in upper and lower respiratory specimens during the acute phase of infection. The lowest concentration of SARS-CoV-2 viral copies this assay can detect is 250 copies / mL. A negative result does not preclude SARS-CoV-2 infection and should not be used as the sole basis for treatment or other patient management  decisions.  A negative result may occur with improper specimen collection / handling, submission of specimen other than nasopharyngeal swab, presence of viral mutation(s) within the areas targeted by this assay, and inadequate number of viral copies (<250 copies / mL). A negative result must be combined with clinical observations, patient history, and epidemiological information.  Fact Sheet for Patients:   StrictlyIdeas.no  Fact Sheet for Healthcare Providers: BankingDealers.co.za  This test is not yet approved or  cleared by the [place] FDA and has been authorized for detection and/or diagnosis of SARS-CoV-2 by FDA under an Emergency Use Authorization (EUA).  This EUA will remain in effect (meaning this test can be used) for the duration of the COVID-19 declaration under Section 564(b)(1) of the Act, 21 U.S.C. section 360bbb-3(b)(1), unless the authorization is terminated or revoked sooner.  Performed at [facility], [place] [address] [place], [place] [address]      Labs: BNP (last 3 results) No results for input(s): BNP in the last 8760 hours. Basic Metabolic Panel: Recent Labs  Lab 07/03/20 1159 07/04/20 0943 07/05/20 0451 07/06/20 0333  NA 137 136 135 136  K 4.4 4.5 4.7 4.4  CL 103 103 104 102  CO2 27 21* 26 27  GLUCOSE 141* 151* 140* 129*  BUN 14 14 24* 22  CREATININE 0.86 1.18* 1.15* 1.12*  CALCIUM 9.0 8.6* 8.5* 8.7*   Liver Function Tests: No  results for input(s): AST, ALT, ALKPHOS, BILITOT, PROT, ALBUMIN in the last 168 hours. No results for input(s): LIPASE, AMYLASE in the last 168 hours. No results for input(s): AMMONIA in the last 168 hours. CBC: Recent Labs  Lab 07/03/20 1159 07/04/20 0943 07/05/20 0451  WBC 8.7 13.1* 11.6*  NEUTROABS 7.1  --   --   HGB 12.7 11.2* 9.6*  HCT 42.2 36.2 31.0*  MCV 90.9 88.5 89.6  PLT 234 255 181   Cardiac Enzymes: No results for input(s): CKTOTAL, CKMB,  CKMBINDEX, TROPONINI in the last 168 hours. BNP: Invalid input(s): POCBNP CBG: No results for input(s): GLUCAP in the last 168 hours. D-Dimer No results for input(s): DDIMER in the last 72 hours. Hgb A1c No results for input(s): HGBA1C in the last 72 hours. Lipid Profile No results for input(s): CHOL, HDL, LDLCALC, TRIG, CHOLHDL, LDLDIRECT in the last 72 hours. Thyroid function studies No results for input(s): TSH, T4TOTAL, T3FREE, THYROIDAB in the last 72 hours.  Invalid input(s): FREET3 Anemia work up No results for input(s): VITAMINB12, FOLATE, FERRITIN, TIBC, IRON, RETICCTPCT in the last 72 hours. Urinalysis    Component Value Date/Time   COLORURINE STRAW (A) 09/05/2017 1345   APPEARANCEUR CLEAR 09/05/2017 1345   LABSPEC 1.012 09/05/2017 1345   PHURINE 7.0 09/05/2017 1345   GLUCOSEU NEGATIVE 09/05/2017 1345   HGBUR NEGATIVE 09/05/2017 1345   [place] 09/05/2017 1345   KETONESUR NEGATIVE 09/05/2017 1345   PROTEINUR 30 (A) 09/05/2017 1345   NITRITE NEGATIVE 09/05/2017 1345   LEUKOCYTESUR NEGATIVE 09/05/2017 1345   Sepsis Labs Invalid input(s): PROCALCITONIN,  WBC,  LACTICIDVEN Microbiology Recent Results (from the past 240 hour(s))  Respiratory Panel by RT PCR (Flu A&B, Covid) - Nasopharyngeal Swab     Status: None   Collection Time: 07/03/20 12:04 PM   Specimen: Nasopharyngeal Swab  Result Value Ref Range Status   SARS Coronavirus 2 by RT PCR NEGATIVE NEGATIVE Final    Comment: (NOTE) SARS-CoV-2 target nucleic acids are NOT DETECTED.  The SARS-CoV-2 RNA is generally detectable in upper respiratoy specimens during the acute phase of infection. The lowest concentration of SARS-CoV-2 viral copies this assay can detect is 131 copies/mL. A negative result does not preclude SARS-Cov-2 infection and should not be used as the sole basis for treatment or other patient management decisions. A negative result may occur with  improper specimen collection/handling,  submission of specimen other than nasopharyngeal swab, presence of viral mutation(s) within the areas targeted by this assay, and inadequate number of viral copies (<131 copies/mL). A negative result must be combined with clinical observations, patient history, and epidemiological information. The expected result is Negative.  Fact Sheet for Patients:  PinkCheek.be  Fact Sheet for Healthcare Providers:  GravelBags.it  This test is no t yet approved or cleared by the [place] FDA and  has been authorized for detection and/or diagnosis of SARS-CoV-2 by FDA under an Emergency Use Authorization (EUA). This EUA will remain  in effect (meaning this test can be used) for the duration of the COVID-19 declaration under Section 564(b)(1) of the Act, 21 U.S.C. section 360bbb-3(b)(1), unless the authorization is terminated or revoked sooner.     Influenza A by PCR NEGATIVE NEGATIVE Final   Influenza B by PCR NEGATIVE NEGATIVE Final    Comment: (NOTE) The Xpert Xpress SARS-CoV-2/FLU/RSV assay is intended as an aid in  the diagnosis of influenza from Nasopharyngeal swab specimens and  should not be used as a sole basis for treatment. Nasal  washings and  aspirates are unacceptable for Xpert Xpress SARS-CoV-2/FLU/RSV  testing.  Fact Sheet for Patients: PinkCheek.be  Fact Sheet for Healthcare Providers: GravelBags.it  This test is not yet approved or cleared by the [place] FDA and  has been authorized for detection and/or diagnosis of SARS-CoV-2 by  FDA under an Emergency Use Authorization (EUA). This EUA will remain  in effect (meaning this test can be used) for the duration of the  Covid-19 declaration under Section 564(b)(1) of the Act, 21  U.S.C. section 360bbb-3(b)(1), unless the authorization is  terminated or revoked. Performed at [facility], [place] [address] [place], [place] [address]   SARS Coronavirus 2 by RT PCR (hospital order, performed in [facility] hospital lab) Nasopharyngeal Nasopharyngeal Swab     Status: None   Collection Time: 07/05/20 12:21 PM   Specimen: Nasopharyngeal Swab  Result Value Ref Range Status   SARS Coronavirus 2 NEGATIVE NEGATIVE Final    Comment: (NOTE) SARS-CoV-2 target nucleic acids are NOT DETECTED.  The SARS-CoV-2 RNA is generally detectable in upper and lower respiratory specimens during the acute phase of infection. The lowest concentration of SARS-CoV-2 viral copies this assay can detect is 250 copies / mL. A negative result does not preclude SARS-CoV-2 infection and should not be used as the sole basis for treatment or other patient management decisions.  A negative result may occur with improper specimen collection / handling, submission of specimen other than nasopharyngeal swab, presence of viral mutation(s) within the areas targeted by this assay, and inadequate number of viral copies (<250 copies / mL). A negative result must be combined with clinical observations, patient history, and epidemiological information.  Fact Sheet for Patients:   StrictlyIdeas.no  Fact Sheet for Healthcare Providers: BankingDealers.co.za  This test is not yet approved or  cleared by the [place] FDA and has been authorized for detection and/or diagnosis of SARS-CoV-2 by FDA under an Emergency Use Authorization (EUA).  This EUA will remain in effect (meaning this test can be used) for the duration of the COVID-19 declaration under Section 564(b)(1) of the Act, 21 U.S.C. section 360bbb-3(b)(1), unless the authorization is terminated or revoked sooner.  Performed at [facility], [place] [address] [place], [place] [address]      Time coordinating discharge: Over 30 minutes  SIGNED:   Johntavius Shepard J [place] DO  [facility] 07/08/2020, 12:08  PM

## 2020-07-08 NOTE — Progress Notes (Signed)
Physical Therapy Treatment Patient Details Name: Samantha Hickman MRN: [id number] DOB: Jan 13, 1929 Today's Date: 07/08/2020    History of Present Illness Pt is an 86 y.o. female admitted 07/03/20 after unwitnessed fall sustaining L intertrochanteric femur fx. S/p L femur cephalomedullary nailing 10/27. PMH includes memory impairment.    PT Comments    Continuing work on functional mobility and activity tolerance;  Session focused on bed mobiltiy and functional transfers; still with difficulty fully standing, and painful LLE; Assited pt to the recliner via 2 person basic pivot transfer; Left with all needs met in chair; Continue to recommend SNF for post-acute therapy needs.    Follow Up Recommendations  SNF;Supervision/Assistance - 24 hour     Equipment Recommendations  Rolling walker with 5" wheels;3in1 (PT);Wheelchair (measurements PT)    Recommendations for Other Services       Precautions / Restrictions Precautions Precautions: Fall Restrictions LLE Weight Bearing: Weight bearing as tolerated    Mobility  Bed Mobility Overal bed mobility: Needs Assistance Bed Mobility: Supine to Sit     Supine to sit: Mod assist;+2 for physical assistance;HOB elevated     General bed mobility comments: ModA+2 to initiate movement, manage BLEs and elevate trunk, pt able to assist with BUE support when cued to use bed rails  Transfers Overall transfer level: Needs assistance Equipment used: Rolling walker (2 wheeled);2 person hand held assist Transfers: Sit to/from [employer] Transfers Sit to Stand: Max assist;+2 physical assistance Stand pivot transfers: Max assist;+2 physical assistance       General transfer comment: Performed 2x sit<>stand from EOB with minA+2 to elevate trunk to RW; pt with difficulty accepting weight onto LLE with knee buckling, ultimately requiring maxA+2 to pivot to recliner  Ambulation/Gait                 Stairs             Wheelchair  Mobility    Modified Rankin (Stroke Patients Only)       Balance     Sitting balance-Leahy Scale: Fair       Standing balance-Leahy Scale: Zero Standing balance comment: unable to get to standing today                            Cognition Arousal/Alertness: Awake/alert Behavior During Therapy: WFL for tasks assessed/performed;Flat affect Overall Cognitive Status: History of cognitive impairments - at baseline                                 General Comments: Sleepy at begining of session      Exercises      General Comments        Pertinent Vitals/Pain Pain Assessment: Faces Faces Pain Scale: Hurts a little bit Pain Location: LLE with WB Pain Descriptors / Indicators: Grimacing Pain Intervention(s): Monitored during session    Home Living                      Prior Function            PT Goals (current goals can now be found in the care plan section) Acute Rehab PT Goals Patient Stated Goal: None stated, but agreeable to getting up  PT Goal Formulation: Patient unable to participate in goal setting Time For Goal Achievement: 07/18/20 Potential to Achieve Goals: Fair Progress towards PT goals: Progressing toward goals (Slowly)  Frequency    Min 2X/week      PT Plan Current plan remains appropriate;Frequency needs to be updated    Co-evaluation              AM-PAC PT "6 Clicks" Mobility   Outcome Measure  Help needed turning from your back to your side while in a flat bed without using bedrails?: A Lot Help needed moving from lying on your back to sitting on the side of a flat bed without using bedrails?: A Lot Help needed moving to and from a bed to a chair (including a wheelchair)?: Total Help needed standing up from a chair using your arms (e.g., wheelchair or bedside chair)?: A Lot Help needed to walk in hospital room?: Total Help needed climbing 3-5 steps with a railing? : Total 6 Click Score:  9    End of Session Equipment Utilized During Treatment: Gait belt Activity Tolerance: Patient tolerated treatment well Patient left: in chair;with call bell/phone within reach;with chair alarm set Nurse Communication: Mobility status PT Visit Diagnosis: Other abnormalities of gait and mobility (R26.89);Muscle weakness (generalized) (M62.81)     Time: 0321-2248 PT Time Calculation (min) (ACUTE ONLY): 20 min  Charges:  $Therapeutic Activity: 8-22 mins                     Roney Marion, PT  Acute Rehabilitation Services Pager [phone] Office [place] 07/08/2020, 8:35 PM

## 2020-07-09 ENCOUNTER — Encounter: Payer: Self-pay | Admitting: Internal Medicine

## 2020-07-09 ENCOUNTER — Non-Acute Institutional Stay (SKILLED_NURSING_FACILITY): Payer: Medicare Other | Admitting: Internal Medicine

## 2020-07-09 DIAGNOSIS — E559 Vitamin D deficiency, unspecified: Secondary | ICD-10-CM | POA: Insufficient documentation

## 2020-07-09 DIAGNOSIS — F0390 Unspecified dementia without behavioral disturbance: Secondary | ICD-10-CM | POA: Diagnosis not present

## 2020-07-09 DIAGNOSIS — A310 Pulmonary mycobacterial infection: Secondary | ICD-10-CM

## 2020-07-09 DIAGNOSIS — S72142A Displaced intertrochanteric fracture of left femur, initial encounter for closed fracture: Secondary | ICD-10-CM

## 2020-07-09 DIAGNOSIS — S72142G Displaced intertrochanteric fracture of left femur, subsequent encounter for closed fracture with delayed healing: Secondary | ICD-10-CM

## 2020-07-09 NOTE — Progress Notes (Signed)
NURSING HOME LOCATION:  [facility] ROOM NUMBER:  312/A  CODE STATUS: Full Code   PCP: Patient, No Pcp Per   This is a comprehensive admission note to [facility] performed on this date less than 30 days from date of admission. Included are preadmission medical/surgical history; reconciled medication list; family history; social history and comprehensive review of systems.  Corrections and additions to the records were documented. Comprehensive physical exam was also performed. Additionally a clinical summary was entered for each active diagnosis pertinent to this admission in the Problem List to enhance continuity of care.  HPI: Patient was hospitalized 10/27-11/09/2019, admitted from home following an unwitnessed fall in which she sustained a displaced intertrochanteric fracture of the left femur. Cephalic medullary narrowing of her left intertrochanteric hip fracture was completed 10/27 by Dr. Doreatha Martin. Pulmonary imaging suggested Mycobacterium avium complex as per Dr. Valeta Harms.  No treatment was pursued in context of severe dementia & advanced age as the treatment usually involves multiple antibiotics for a period of 1-2 years and medications are typically poorly tolerated. Profound vitamin D deficiency was present with a value of 6.77.  Ergocalciferol 50,000 units weekly was initiated with vitamin D3 2000 units twice daily. Lovenox for DVT prophylaxis was continued postop.  Orthopedic follow-up was to be in 2 weeks following discharge.  Past medical and surgical history: Only history recorded is history of neurocognitive deficits/memory impairment.    Social history: Nondrinker; never smoked.She had been residing at home with periodic caregivers.  Family history: No history on file; family history is noncontributory as she is 86 years old.   Review of systems:  Could not be completed due to dementia.  She can provide no history. She denied being in the hospital. She denied  having any pain. Despite the coughing I noted; she denied any dysphagia. She could provide no date including the year. She could not provide the name of the president. She had great difficulty following even simple commands.  Physical exam:  Pertinent or positive findings: She appears her stated age. Hair is disheveled. Eyebrows are essentially nonexistent. Lunch was on the bedside table but she had only eaten the desert which was a pudding. She was coughing nonproductively intermittently. She has bilateral inspiratory expiratory rhonchi and low-grade rales. Dorsalis pedis pulses are stronger than the posterior tibial pulses. There is trace edema at the sock line. Ecchymosis is noted over the dorsum of the right hand.  General appearance: Adequately nourished; no acute distress, increased work of breathing is present.   Lymphatic: No lymphadenopathy about the head, neck, axilla. Eyes: No conjunctival inflammation or lid edema is present. There is no scleral icterus. Ears:  External ear exam shows no significant lesions or deformities.   Nose:  External nasal examination shows no deformity or inflammation. Nasal mucosa are pink and moist without lesions, exudates Oral exam: Lips and gums are healthy appearing.There is no oropharyngeal erythema or exudate. Neck:  No thyromegaly, masses, tenderness noted.    Heart:  Normal rate and regular rhythm. S1 and S2 normal without gallop, murmur, click, rub.  Lungs:  without wheezes,  rubs. Abdomen: Bowel sounds are normal.  Abdomen is soft and nontender with no organomegaly, hernias, masses. GU: Deferred  Extremities:  No cyanosis, clubbing. Neurologic exam: Balance, Rhomberg, finger to nose testing could not be completed due to clinical state Skin: Warm & dry w/o tenting. No significant lesions or rash.  See clinical summary under each active problem in the Problem List with associated  updated therapeutic plan

## 2020-07-09 NOTE — Patient Instructions (Signed)
See assessment and plan under each diagnosis in the problem list and acutely for this visit 

## 2020-07-09 NOTE — Assessment & Plan Note (Addendum)
Dr Tonia Brooms Pulmonolgy consulted and recommended no treatment due to patient's advanced age, severe dementia and the typically toxic nature of the prolonged multiple antibiotic therapy. Coughing noted pc may be inherent to pulmonary disease but dysphagia & aspiration risk should be assessed by Speech Therapy

## 2020-07-09 NOTE — Assessment & Plan Note (Addendum)
Patient could provide no history including the date or the president's name. She cannot follow commands. She denied having pain and denied being in the hospital. History states that the patient had been living at home with caregivers. SL[facility]MS MMSE may help clarify the severity of her neurocognitive deficits and help assess her caregiver needs. Check B12 level & TSH.

## 2020-07-09 NOTE — Assessment & Plan Note (Addendum)
PT/OT at SNF as tolerated.  Follow-up with orthopedics 2 weeks post discharge.  Continue Lovenox prophylaxis for DVT.

## 2020-07-09 NOTE — Assessment & Plan Note (Signed)
Vitamin D 50,000 units weekly

## 2020-07-10 LAB — VITAMIN B12: Vitamin B-12: 422

## 2020-07-10 LAB — TSH: TSH: 2.27 (ref 0.41–5.90)

## 2020-07-17 ENCOUNTER — Encounter: Payer: Self-pay | Admitting: Adult Health

## 2020-07-17 ENCOUNTER — Non-Acute Institutional Stay (SKILLED_NURSING_FACILITY): Payer: Medicare Other | Admitting: Adult Health

## 2020-07-17 DIAGNOSIS — S72142S Displaced intertrochanteric fracture of left femur, sequela: Secondary | ICD-10-CM

## 2020-07-17 DIAGNOSIS — E559 Vitamin D deficiency, unspecified: Secondary | ICD-10-CM

## 2020-07-17 DIAGNOSIS — F0390 Unspecified dementia without behavioral disturbance: Secondary | ICD-10-CM | POA: Diagnosis not present

## 2020-07-17 DIAGNOSIS — A310 Pulmonary mycobacterial infection: Secondary | ICD-10-CM

## 2020-07-17 NOTE — Progress Notes (Addendum)
Location:  [facility] Room Number: 312-A Place of Service:  SNF (31) Provider:  Kenard Gower, DNP, FNP-BC  Patient Care Team: Patient, No Pcp Per as PCP - General (General Practice)  Extended Emergency Contact Information Primary Emergency Contact: Daleen Bo [place] of [place] Home Phone: [phone] Relation: Son Secondary Emergency Contact: Beverley Fiedler [place] Home Phone: [phone] Relation: Son  Code Status:  DNR  Goals of care: Advanced Directive information Advanced Directives 07/09/2020  Does Patient Have a Medical Advance Directive? Yes  Type of Estate agent of [place];Out of facility DNR (pink MOST or yellow form)  Does patient want to make changes to medical advance directive? No - Patient declined  Copy of Healthcare Power of Attorney in Chart? Yes - validated most recent copy scanned in chart (See row information)  Would patient like information on creating a medical advance directive? -     Chief Complaint  Patient presents with  . Medical Management of Chronic Issues    Short-term rehabilitation visit    HPI:  Pt is an 86 y.o. female seen today for routine short-term rehabilitation visit.  She has a PMH of memory impairment. She was seen in the room today. Left hip surgical incision is dry, no erythema. No edema noted. She takes Furosemide 20 mg daily for BLE edema. She is currently having PT, OT and ST.   She was admitted to [facility] on 07/08/2020 post hospitalization 07/03/2020 to 07/08/2020 due to an unwitnessed fall at home for which she sustained left hip fracture.  She underwent for cephalic medullary nailing by orthopedics, Dr. Jena Gauss, on 07/03/2020.   Past Medical History:  Diagnosis Date  . Memory impairment    Past Surgical History:  Procedure Laterality Date  . INTRAMEDULLARY (IM) NAIL INTERTROCHANTERIC Left 07/03/2020   Procedure:  INTRAMEDULLARY (IM) NAIL INTERTROCHANTRIC;  Surgeon: Roby Lofts, MD;  Location: [facility] OR;  Service: Orthopedics;  Laterality: Left;  . ORIF FEMUR FRACTURE Left 07/03/2020    No Known Allergies  Outpatient Encounter Medications as of 07/17/2020  Medication Sig  . docusate sodium (COLACE) 100 MG capsule Take 1 capsule (100 mg total) by mouth 2 (two) times daily.  Marland Kitchen enoxaparin (LOVENOX) 40 MG/0.4ML injection Inject 0.4 mLs (40 mg total) into the skin daily.  . furosemide (LASIX) 20 MG tablet Take 20 mg by mouth daily.  Marland Kitchen HYDROcodone-acetaminophen (NORCO/VICODIN) 5-325 MG tablet Take 1 tablet by mouth every 6 (six) hours as needed for severe pain.  . methocarbamol (ROBAXIN) 500 MG tablet Take 1 tablet (500 mg total) by mouth every 6 (six) hours as needed for muscle spasms.  . Multiple Vitamin (MULTIVITAMIN WITH MINERALS) TABS tablet Take 1 tablet by mouth daily.  . Vitamin D, Ergocalciferol, (DRISDOL) 1.25 MG (50000 UNIT) CAPS capsule Take 1 capsule (50,000 Units total) by mouth every 7 (seven) days.   No facility-administered encounter medications on file as of 07/17/2020.    Review of Systems  GENERAL: No change in appetite, no fatigue, no weight changes, no fever, chills or weakness MOUTH and THROAT: Denies oral discomfort, gingival pain or bleeding RESPIRATORY: no cough, SOB, DOE, wheezing, hemoptysis CARDIAC: No chest pain, edema or palpitations GI: No abdominal pain, diarrhea, constipation, heart burn, nausea or vomiting NEUROLOGICAL: Denies dizziness, syncope, numbness, or headache PSYCHIATRIC: Denies feelings of depression or anxiety. No report of hallucinations, insomnia, paranoia, or agitation   There is no immunization history on file for this patient. Pertinent  Health Maintenance Due  Topic Date Due  . DEXA SCAN  Never done  . PNA vac Low Risk Adult (1 of 2 - PCV13) Never done  . INFLUENZA VACCINE  Never done    Vitals:   07/17/20 1155  BP: 140/70  Pulse: 82    Resp: 18  Temp: 97.7 F (36.5 C)  TempSrc: Oral  Weight: 143 lb 9.6 oz (65.1 kg)  Height: 5\' 1"  (1.549 m)   Body mass index is 27.13 kg/m.  Physical Exam  GENERAL APPEARANCE: Well nourished. In no acute distress. Normal body habitus SKIN:  Left hip surgical incision is dry, no erythema MOUTH and THROAT: Lips are without lesions. Oral mucosa is moist and without lesions.  RESPIRATORY: Breathing is even & unlabored, BS CTAB CARDIAC: RRR, no murmur,no extra heart sounds, no edema GI: Abdomen soft, normal BS, no masses, no tenderness, EXTREMITIES:  Able to move X 4 extremities NEUROLOGICAL: There is no tremor. Speech is clear. Alert to self, disoriented to time and place. PSYCHIATRIC:  Affect and behavior are appropriate  Labs reviewed: Recent Labs    07/04/20 0943 07/05/20 0451 07/06/20 0333  NA 136 135 136  K 4.5 4.7 4.4  CL 103 104 102  CO2 21* 26 27  GLUCOSE 151* 140* 129*  BUN 14 24* 22  CREATININE 1.18* 1.15* 1.12*  CALCIUM 8.6* 8.5* 8.7*    Recent Labs    07/03/20 1159 07/04/20 0943 07/05/20 0451  WBC 8.7 13.1* 11.6*  NEUTROABS 7.1  --   --   HGB 12.7 11.2* 9.6*  HCT 42.2 36.2 31.0*  MCV 90.9 88.5 89.6  PLT 234 255 181    Significant Diagnostic Results in last 30 days:  DG Chest 1 View  Result Date: 07/03/2020 CLINICAL DATA:  Pain following fall EXAM: CHEST  1 VIEW COMPARISON:  September 05, 2017 chest radiograph; chest CT October 13, 2017 FINDINGS: There is diffuse interstitial thickening with scattered subcentimeter nodular opacities, also present on prior CT examination. Underlying areas of bronchiectatic change noted in the lower lobe regions. No edema or airspace opacity. Heart is upper normal in size with pulmonary vascularity normal. There is aortic atherosclerosis. Bones are osteoporotic. IMPRESSION: Interstitial thickening with scattered subcentimeter nodular lesions, also seen previously. Underlying bronchiectatic change. There is likely a degree  of chronic bronchitis. The appearance on previous CT is felt to be indicative of atypical infectious lesion such as Mycobacterium avium intracellulare. No frank edema or consolidation. Stable cardiac silhouette. Aortic Atherosclerosis (ICD10-I70.0). Electronically Signed   By: October 15, 2017 III M.D.   On: 07/03/2020 12:53   DG C-Arm 1-60 Min  Result Date: 07/03/2020 CLINICAL DATA:  Open reduction internal fixation. EXAM: OPERATIVE LEFT HIP (WITH PELVIS IF PERFORMED) 6 VIEWS TECHNIQUE: Fluoroscopic spot image(s) were submitted for interpretation post-operatively. COMPARISON:  Hip radiographs 07/03/2020 FINDINGS: Fluoroscopic time: 1 minutes and 28 seconds. Six C-arm fluoroscopic images were obtained intraoperatively and submitted for post operative interpretation. These images demonstrate cephalomedullary nail fixation of a left intertrochanteric femur fracture with improved, near anatomic alignment. Please see the performing provider's procedural report for further detail. IMPRESSION: Intraoperative fluoroscopic imaging, as detailed above. Electronically Signed   By: 07/05/2020 MD   On: 07/03/2020 16:38   DG HIP PORT UNILAT W OR W/O PELVIS 1V LEFT  Result Date: 07/03/2020 CLINICAL DATA:  Postop. EXAM: DG HIP (WITH OR WITHOUT PELVIS) 1V PORT LEFT COMPARISON:  Preoperative radiographs earlier this day. FINDINGS: Intramedullary nail with 2 trans trochanteric and single  distal locking screw fixation of intertrochanteric femur fracture. The intertrochanteric fracture is in improved alignment compared to preoperative imaging. There is a lucency of the lateral proximal femoral shaft adjacent to the distal locking screw that may abut the femoral stem that was not seen on preoperative radiograph. Bones are diffusely under mineralized. Pubic rami are intact. IMPRESSION: 1. ORIF of intertrochanteric left femur fracture, in improved alignment compared to preoperative imaging. 2. Linear lucency of the  lateral proximal femoral shaft adjacent to the distal locking screw, may abut the femoral stem, not seen on preoperative radiograph, suspicious for periprosthetic fracture. Recommend attention on follow-up. Electronically Signed   By: Narda Rutherford M.D.   On: 07/03/2020 19:03   DG HIP OPERATIVE UNILAT WITH PELVIS LEFT  Result Date: 07/03/2020 CLINICAL DATA:  Open reduction internal fixation. EXAM: OPERATIVE LEFT HIP (WITH PELVIS IF PERFORMED) 6 VIEWS TECHNIQUE: Fluoroscopic spot image(s) were submitted for interpretation post-operatively. COMPARISON:  Hip radiographs 07/03/2020 FINDINGS: Fluoroscopic time: 1 minutes and 28 seconds. Six C-arm fluoroscopic images were obtained intraoperatively and submitted for post operative interpretation. These images demonstrate cephalomedullary nail fixation of a left intertrochanteric femur fracture with improved, near anatomic alignment. Please see the performing provider's procedural report for further detail. IMPRESSION: Intraoperative fluoroscopic imaging, as detailed above. Electronically Signed   By: Feliberto Harts MD   On: 07/03/2020 16:38   DG Hip Unilat W or Wo Pelvis 2-3 Views Left  Result Date: 07/03/2020 CLINICAL DATA:  Left hip pain EXAM: DG HIP (WITH OR WITHOUT PELVIS) 2-3V LEFT COMPARISON:  None. FINDINGS: Diffuse osseous demineralization. Acute intertrochanteric fracture of the proximal left femur with slight varus angulation. There is a rounded 1.9 cm nonspecific sclerotic area within the intertrochanteric aspect of the femur. No additional sclerotic or lytic bony abnormalities are seen within the left femur or visualized pelvis. IMPRESSION: 1. Acute intertrochanteric fracture of the proximal left femur with slight varus angulation. 2. Nonspecific 1.9 cm sclerotic area within the intertrochanteric aspect of the left femur. Although this appearance could be posttraumatic, an underlying bone lesion is not excluded. Correlation with prior outside  imaging, if available, which includes the proximal femur is recommended. Electronically Signed   By: Duanne Guess D.O.   On: 07/03/2020 12:53    Assessment/Plan  1. Closed displaced intertrochanteric fracture of left femur, sequela - S/P cephalic medullary nailing on 22/63/3354 -  Continue Norco 5-325 mg 1 tab every 12 hours PRN for pain, Lovenox 40 mg subcu for DVT prophylaxis with stop date 08/04/2020 -Follow-up with orthopedics in 2 weeks  2. Mycobacterium avium infection (HCC) -Due to advanced dementia, no further plan for evaluation or treatment at this time  3. Vitamin D deficiency -Continue vitamin D2 1.25 mg 1 capsule weekly  4. Dementia without behavioral disturbance, unspecified dementia type (HCC) -Continue supportive care and fall precautions     Family/ staff Communication:   Discussed plan of care with charge nurse.  Labs/tests ordered: None  Goals of care:   Short-term care   Kenard Gower, DNP, MSN, FNP-BC [facility] and Adult Medicine [phone] (Monday-Friday 8:00 a.m. - 5:00 p.m.) [phone] (after hours)

## 2020-07-19 ENCOUNTER — Other Ambulatory Visit: Payer: Self-pay | Admitting: Adult Health

## 2020-07-19 MED ORDER — HYDROCODONE-ACETAMINOPHEN 5-325 MG PO TABS: 1.0000 | ORAL_TABLET | Freq: Two times a day (BID) | ORAL | 0 refills | Status: AC | PRN

## 2020-07-26 ENCOUNTER — Encounter: Payer: Self-pay | Admitting: Adult Health

## 2020-07-26 ENCOUNTER — Non-Acute Institutional Stay (SKILLED_NURSING_FACILITY): Payer: Medicare Other | Admitting: Adult Health

## 2020-07-26 DIAGNOSIS — D62 Acute posthemorrhagic anemia: Secondary | ICD-10-CM

## 2020-07-26 DIAGNOSIS — S72142S Displaced intertrochanteric fracture of left femur, sequela: Secondary | ICD-10-CM | POA: Diagnosis not present

## 2020-07-26 DIAGNOSIS — F0390 Unspecified dementia without behavioral disturbance: Secondary | ICD-10-CM

## 2020-07-26 NOTE — Progress Notes (Signed)
Location:  [facility] Room Number: 127-A Place of Service:  SNF (31) Provider:  Kenard Gower, DNP, FNP-BC  Patient Care Team: Patient, No Pcp Per as PCP - General (General Practice)  Extended Emergency Contact Information Primary Emergency Contact: Daleen Bo [place] Home Phone: [phone] Relation: Son Secondary Emergency Contact: Beverley Fiedler [place] Home Phone: [phone] Relation: Son  Code Status:  DNR  Goals of care: Advanced Directive information Advanced Directives 07/26/2020  Does Patient Have a Medical Advance Directive? Yes  Type of Estate agent of [place];Out of facility DNR (pink MOST or yellow form)  Does patient want to make changes to medical advance directive? No - Patient declined  Copy of Healthcare Power of Attorney in Chart? Yes - validated most recent copy scanned in chart (See row information)  Would patient like information on creating a medical advance directive? No - Patient declined  Pre-existing out of facility DNR order (yellow form or pink MOST form) Yellow form placed in chart (order not valid for inpatient use)     Chief Complaint  Patient presents with  . Medical Management of Chronic Issues    Short-term rehabilitation visit    HPI:  Pt is an 86 y.o. female seen today for medical management of chronic diseases. She is a short-term care resident of [facility]. She has a PMH of memory impairment. Latest hgb 9.6, 07/05/20. Left hip surgical incision is healed.   She was admitted to [facility] on 07/08/2020 post hospitalization 07/03/2020 to 07/08/2020 for an unwitnessed fall at home for which she had cephalic medullary nailing by orthopedics, Dr. Jena Gauss, on 07/03/2020.     Past Medical History:  Diagnosis Date  . Memory impairment    Past Surgical History:  Procedure Laterality Date  .  INTRAMEDULLARY (IM) NAIL INTERTROCHANTERIC Left 07/03/2020   Procedure: INTRAMEDULLARY (IM) NAIL INTERTROCHANTRIC;  Surgeon: Roby Lofts, MD;  Location: [facility] OR;  Service: Orthopedics;  Laterality: Left;  . ORIF FEMUR FRACTURE Left 07/03/2020    No Known Allergies  Outpatient Encounter Medications as of 07/26/2020  Medication Sig  . acetaminophen (TYLENOL) 500 MG tablet Take 500 mg by mouth every 4 (four) hours as needed. For Pain  . docusate sodium (COLACE) 100 MG capsule Take 1 capsule (100 mg total) by mouth 2 (two) times daily.  Marland Kitchen enoxaparin (LOVENOX) 40 MG/0.4ML injection Inject 0.4 mLs (40 mg total) into the skin daily.  . furosemide (LASIX) 20 MG tablet Take 20 mg by mouth daily.  Marland Kitchen HYDROcodone-acetaminophen (NORCO/VICODIN) 5-325 MG tablet Take 1 tablet by mouth every 12 (twelve) hours as needed. For Pain  . Multiple Vitamin (MULTIVITAMIN WITH MINERALS) TABS tablet Take 1 tablet by mouth daily.  . NON FORMULARY Med Pass to help prevent weight loss and malnutrition.  . Vitamin D, Ergocalciferol, (DRISDOL) 1.25 MG (50000 UNIT) CAPS capsule Take 1 capsule (50,000 Units total) by mouth every 7 (seven) days.   No facility-administered encounter medications on file as of 07/26/2020.    Review of Systems  GENERAL: No change in appetite, no fatigue, no weight changes, no fever, chills or weakness MOUTH and THROAT: Denies oral discomfort, gingival pain or bleeding   RESPIRATORY: no cough, SOB, DOE, wheezing, hemoptysis CARDIAC: No chest pain, edema or palpitations GI: No abdominal pain, diarrhea, constipation, heart burn, nausea or vomiting GU: Denies dysuria, frequency, hematuria or discharge NEUROLOGICAL: Denies dizziness, syncope, numbness, or headache PSYCHIATRIC: Denies feelings  of depression or anxiety. No report of hallucinations, insomnia, paranoia, or agitation    There is no immunization history on file for this patient. Pertinent  Health Maintenance Due  Topic  Date Due  . DEXA SCAN  Never done  . PNA vac Low Risk Adult (1 of 2 - PCV13) Never done  . INFLUENZA VACCINE  Never done    Vitals:   07/26/20 0936  BP: 126/64  Pulse: 68  Resp: 18  Temp: (!) 97.2 F (36.2 C)  TempSrc: Oral  SpO2: 97%  Weight: 141 lb 6.4 oz (64.1 kg)  Height: 5\' 1"  (1.549 m)   Body mass index is 26.72 kg/m.  Physical Exam  GENERAL APPEARANCE: Well nourished. In no acute distress. Normal body habitus SKIN:  Left hip surgical incision is dry, no erythema MOUTH and THROAT: Lips are without lesions. Oral mucosa is moist and without lesions. Tongue is normal in shape, size, and color and without lesions RESPIRATORY: Breathing is even & unlabored, BS CTAB CARDIAC: RRR, no murmur,no extra heart sounds, no edema GI: Abdomen soft, normal BS, no masses, no tenderness EXTREMITIES:  Able to move X 4 extremities NEUROLOGICAL: There is no tremor. Speech is clear. Alert to self, disoriented to time and place. PSYCHIATRIC:  Affect and behavior are appropriate  Labs reviewed: Recent Labs    07/04/20 0943 07/05/20 0451 07/06/20 0333  NA 136 135 136  K 4.5 4.7 4.4  CL 103 104 102  CO2 21* 26 27  GLUCOSE 151* 140* 129*  BUN 14 24* 22  CREATININE 1.18* 1.15* 1.12*  CALCIUM 8.6* 8.5* 8.7*    Recent Labs    07/03/20 1159 07/04/20 0943 07/05/20 0451  WBC 8.7 13.1* 11.6*  NEUTROABS 7.1  --   --   HGB 12.7 11.2* 9.6*  HCT 42.2 36.2 31.0*  MCV 90.9 88.5 89.6  PLT 234 255 181    Significant Diagnostic Results in last 30 days:  DG Chest 1 View  Result Date: 07/03/2020 CLINICAL DATA:  Pain following fall EXAM: CHEST  1 VIEW COMPARISON:  September 05, 2017 chest radiograph; chest CT October 13, 2017 FINDINGS: There is diffuse interstitial thickening with scattered subcentimeter nodular opacities, also present on prior CT examination. Underlying areas of bronchiectatic change noted in the lower lobe regions. No edema or airspace opacity. Heart is upper normal in size  with pulmonary vascularity normal. There is aortic atherosclerosis. Bones are osteoporotic. IMPRESSION: Interstitial thickening with scattered subcentimeter nodular lesions, also seen previously. Underlying bronchiectatic change. There is likely a degree of chronic bronchitis. The appearance on previous CT is felt to be indicative of atypical infectious lesion such as Mycobacterium avium intracellulare. No frank edema or consolidation. Stable cardiac silhouette. Aortic Atherosclerosis (ICD10-I70.0). Electronically Signed   By: October 15, 2017 III M.D.   On: 07/03/2020 12:53   DG C-Arm 1-60 Min  Result Date: 07/03/2020 CLINICAL DATA:  Open reduction internal fixation. EXAM: OPERATIVE LEFT HIP (WITH PELVIS IF PERFORMED) 6 VIEWS TECHNIQUE: Fluoroscopic spot image(s) were submitted for interpretation post-operatively. COMPARISON:  Hip radiographs 07/03/2020 FINDINGS: Fluoroscopic time: 1 minutes and 28 seconds. Six C-arm fluoroscopic images were obtained intraoperatively and submitted for post operative interpretation. These images demonstrate cephalomedullary nail fixation of a left intertrochanteric femur fracture with improved, near anatomic alignment. Please see the performing provider's procedural report for further detail. IMPRESSION: Intraoperative fluoroscopic imaging, as detailed above. Electronically Signed   By: 07/05/2020 MD   On: 07/03/2020 16:38   DG HIP PORT UNILAT  W OR W/O PELVIS 1V LEFT  Result Date: 07/03/2020 CLINICAL DATA:  Postop. EXAM: DG HIP (WITH OR WITHOUT PELVIS) 1V PORT LEFT COMPARISON:  Preoperative radiographs earlier this day. FINDINGS: Intramedullary nail with 2 trans trochanteric and single distal locking screw fixation of intertrochanteric femur fracture. The intertrochanteric fracture is in improved alignment compared to preoperative imaging. There is a lucency of the lateral proximal femoral shaft adjacent to the distal locking screw that may abut the femoral stem  that was not seen on preoperative radiograph. Bones are diffusely under mineralized. Pubic rami are intact. IMPRESSION: 1. ORIF of intertrochanteric left femur fracture, in improved alignment compared to preoperative imaging. 2. Linear lucency of the lateral proximal femoral shaft adjacent to the distal locking screw, may abut the femoral stem, not seen on preoperative radiograph, suspicious for periprosthetic fracture. Recommend attention on follow-up. Electronically Signed   By: Narda Rutherford M.D.   On: 07/03/2020 19:03   DG HIP OPERATIVE UNILAT WITH PELVIS LEFT  Result Date: 07/03/2020 CLINICAL DATA:  Open reduction internal fixation. EXAM: OPERATIVE LEFT HIP (WITH PELVIS IF PERFORMED) 6 VIEWS TECHNIQUE: Fluoroscopic spot image(s) were submitted for interpretation post-operatively. COMPARISON:  Hip radiographs 07/03/2020 FINDINGS: Fluoroscopic time: 1 minutes and 28 seconds. Six C-arm fluoroscopic images were obtained intraoperatively and submitted for post operative interpretation. These images demonstrate cephalomedullary nail fixation of a left intertrochanteric femur fracture with improved, near anatomic alignment. Please see the performing provider's procedural report for further detail. IMPRESSION: Intraoperative fluoroscopic imaging, as detailed above. Electronically Signed   By: Feliberto Harts MD   On: 07/03/2020 16:38   DG Hip Unilat W or Wo Pelvis 2-3 Views Left  Result Date: 07/03/2020 CLINICAL DATA:  Left hip pain EXAM: DG HIP (WITH OR WITHOUT PELVIS) 2-3V LEFT COMPARISON:  None. FINDINGS: Diffuse osseous demineralization. Acute intertrochanteric fracture of the proximal left femur with slight varus angulation. There is a rounded 1.9 cm nonspecific sclerotic area within the intertrochanteric aspect of the femur. No additional sclerotic or lytic bony abnormalities are seen within the left femur or visualized pelvis. IMPRESSION: 1. Acute intertrochanteric fracture of the proximal left  femur with slight varus angulation. 2. Nonspecific 1.9 cm sclerotic area within the intertrochanteric aspect of the left femur. Although this appearance could be posttraumatic, an underlying bone lesion is not excluded. Correlation with prior outside imaging, if available, which includes the proximal femur is recommended. Electronically Signed   By: Duanne Guess D.O.   On: 07/03/2020 12:53    Assessment/Plan  1. Anemia due to acute blood loss Lab Results  Component Value Date   HGB 9.6 (L) 07/05/2020   -  stable  2. Closed displaced intertrochanteric fracture of left femur, sequela - S/P cephalic medullary nailing on 41/28/7867 -Continue Lovenox for DVT prophylaxis until 08/03/2020, PRN Norco for pain -Follow-up with orthopedics  3. Dementia without behavioral disturbance, unspecified dementia type (HCC) -Continue supportive care and fall precautions     Family/ staff Communication:  Discussed plan of care with  charge nurse.  Labs/tests ordered:  None  Goals of care:   Short-term care  Kenard Gower, DNP, MSN, FNP-BC [facility] [phone] (Monday-Friday 8:00 a.m. - 5:00 p.m.) [phone] (after hours)

## 2020-07-30 ENCOUNTER — Encounter: Payer: Self-pay | Admitting: Adult Health

## 2020-08-06 LAB — BASIC METABOLIC PANEL
BUN: 20 (ref 4–21)
CO2: 25 — AB (ref 13–22)
Chloride: 99 (ref 99–108)
Creatinine: 0.8 (ref 0.5–1.1)
Glucose: 215
Potassium: 4.5 (ref 3.4–5.3)
Sodium: 135 — AB (ref 137–147)

## 2020-08-06 LAB — CBC: RBC: 3.86 — AB (ref 3.87–5.11)

## 2020-08-06 LAB — COMPREHENSIVE METABOLIC PANEL
Calcium: 9.2 (ref 8.7–10.7)
GFR calc Af Amer: 76.7
GFR calc non Af Amer: 66.18

## 2020-08-06 LAB — CBC AND DIFFERENTIAL
HCT: 33 — AB (ref 36–46)
Hemoglobin: 10.9 — AB (ref 12.0–16.0)
Neutrophils Absolute: 12.2
Platelets: 260 (ref 150–399)
WBC: 13.9

## 2020-08-08 ENCOUNTER — Non-Acute Institutional Stay (SKILLED_NURSING_FACILITY): Payer: Medicare Other | Admitting: Adult Health

## 2020-08-08 ENCOUNTER — Encounter: Payer: Self-pay | Admitting: Adult Health

## 2020-08-08 DIAGNOSIS — R638 Other symptoms and signs concerning food and fluid intake: Secondary | ICD-10-CM | POA: Diagnosis not present

## 2020-08-08 DIAGNOSIS — S72142D Displaced intertrochanteric fracture of left femur, subsequent encounter for closed fracture with routine healing: Secondary | ICD-10-CM | POA: Diagnosis not present

## 2020-08-08 DIAGNOSIS — D72829 Elevated white blood cell count, unspecified: Secondary | ICD-10-CM

## 2020-08-08 NOTE — Progress Notes (Signed)
Location:  [facility] Room Number: 127-A Place of Service:  SNF (31) Provider:  Kenard Gower, DNP, FNP-BC  Patient Care Team: Patient, No Pcp Per as PCP - General (General Practice)  Extended Emergency Contact Information Primary Emergency Contact: Daleen Bo [place] Home Phone: [phone] Relation: Son Secondary Emergency Contact: Beverley Fiedler [place] Home Phone: [phone] Relation: Son  Code Status:  DNR  Goals of care: Advanced Directive information Advanced Directives 07/26/2020  Does Patient Have a Medical Advance Directive? Yes  Type of Estate agent of [place];Out of facility DNR (pink MOST or yellow form)  Does patient want to make changes to medical advance directive? No - Patient declined  Copy of Healthcare Power of Attorney in Chart? Yes - validated most recent copy scanned in chart (See row information)  Would patient like information on creating a medical advance directive? No - Patient declined  Pre-existing out of facility DNR order (yellow form or pink MOST form) Yellow form placed in chart (order not valid for inpatient use)     Chief Complaint  Patient presents with  . Medical Management of Chronic Issues    Short-term rehabilitation visit    HPI:  Pt is an 86 y.o. female seen today for medical management of chronic diseases.  She is a short-term rehabilitation resident of [facility].  She has a PMH of memory impairment/neurocognitive deficits. She was reported to have vomited X 1. She was seen in the room today. She is confused X 3. Roommate reported that she has occasional coughing. COVID-19 nasal swab done today was negative. Latest wbc  13.9, elevated. There was no reported fever. Charge nurse reported that she has poor oral intake. Urinalysis was previously ordered but charge nurse was not able to obtain when in/out catheterization was  done. Resident is incontinent of urine.   Past Medical History:  Diagnosis Date  . Memory impairment    Past Surgical History:  Procedure Laterality Date  . INTRAMEDULLARY (IM) NAIL INTERTROCHANTERIC Left 07/03/2020   Procedure: INTRAMEDULLARY (IM) NAIL INTERTROCHANTRIC;  Surgeon: Roby Lofts, MD;  Location: [facility] OR;  Service: Orthopedics;  Laterality: Left;  . ORIF FEMUR FRACTURE Left 07/03/2020    No Known Allergies  Outpatient Encounter Medications as of 08/08/2020  Medication Sig  . acetaminophen (TYLENOL) 500 MG tablet Take 500 mg by mouth every 4 (four) hours as needed. For Pain  . docusate sodium (COLACE) 100 MG capsule Take 1 capsule (100 mg total) by mouth 2 (two) times daily.  . furosemide (LASIX) 20 MG tablet Take 20 mg by mouth every Monday, Wednesday, and Friday.   Marland Kitchen HYDROcodone-acetaminophen (NORCO/VICODIN) 5-325 MG tablet Take 1 tablet by mouth every 12 (twelve) hours as needed. For Pain  . Multiple Vitamin (MULTIVITAMIN WITH MINERALS) TABS tablet Take 1 tablet by mouth daily.  . NON FORMULARY Med Pass to help prevent weight loss and malnutrition.  . Vitamin D, Ergocalciferol, (DRISDOL) 1.25 MG (50000 UNIT) CAPS capsule Take 1 capsule (50,000 Units total) by mouth every 7 (seven) days.  . [DISCONTINUED] enoxaparin (LOVENOX) 40 MG/0.4ML injection Inject 0.4 mLs (40 mg total) into the skin daily.   No facility-administered encounter medications on file as of 08/08/2020.    Review of Systems unable to obtain due to dementia   There is no immunization history on file for this patient. Pertinent  Health Maintenance Due  Topic Date Due  . DEXA SCAN  Never done  .  PNA vac Low Risk Adult (1 of 2 - PCV13) Never done  . INFLUENZA VACCINE  Never done    Vitals:   08/08/20 1510  BP: 130/64  Pulse: 60  Resp: 18  Temp: 97.8 F (36.6 C)  TempSrc: Oral  Weight: 143 lb 9.6 oz (65.1 kg)  Height: 5\' 1"  (1.549 m)   Body mass index is 27.13 kg/m.  Physical  Exam  GENERAL APPEARANCE:  Normal body habitus SKIN:  Skin is warm and dry.  MOUTH and THROAT: Lips are without lesions. Oral mucosa is moist and without lesions. Tongue is normal in shape, size, and color and without lesions RESPIRATORY: Breathing is even & unlabored, BS CTAB CARDIAC: RRR, no murmur,no extra heart sounds, no edema GI: Abdomen soft, normal BS, no masses, no tenderness NEUROLOGICAL: There is no tremor. Confused X 3. PSYCHIATRIC:  Affect and behavior are appropriate  Labs reviewed: Recent Labs    07/04/20 0943 07/04/20 0943 07/05/20 0451 07/06/20 0333 08/06/20 0000  NA 136   < > 135 136 135*  K 4.5   < > 4.7 4.4 4.5  CL 103   < > 104 102 99  CO2 21*   < > 26 27 25*  GLUCOSE 151*  --  140* 129*  --   BUN 14   < > 24* 22 20  CREATININE 1.18*   < > 1.15* 1.12* 0.8  CALCIUM 8.6*   < > 8.5* 8.7* 9.2   < > = values in this interval not displayed.    Recent Labs    07/03/20 1159 07/03/20 1159 07/04/20 0943 07/05/20 0451 08/06/20 0000  WBC 8.7   < > 13.1* 11.6* 13.9  NEUTROABS 7.1  --   --   --  12.20  HGB 12.7   < > 11.2* 9.6* 10.9*  HCT 42.2   < > 36.2 31.0* 33*  MCV 90.9  --  88.5 89.6  --   PLT 234   < > 255 181 260   < > = values in this interval not displayed.   Lab Results  Component Value Date   TSH 2.27 07/10/2020    Assessment/Plan  1. Poor fluid intake -  Will decreased Lasix 20 mg from daily to MWF -  Will start D5.9NS @ 70 ml/hour X 1L via PIV  2. Leukocytosis, unspecified type Lab Results  Component Value Date   WBC 13.9 08/06/2020   -  Will do chest x-ray to rule out pneumonia and UA with CS to rule out UTI  3. Closed displaced intertrochanteric fracture of left femur with routine healing, subsequent encounter - S/P cephalic medullary nailing on 08/08/2020 -  Continue PT and OT for therapeutic strengthening exercises -  Follow up with orthopedics     Family/ staff Communication: Discussed plan of care with charge  nurse.  Labs/tests ordered:  Chest x-ray PA and lateral, CBC, urinalysis with culture and sensitivity and BMP  Goals of care: Short-term care   18/84/16, DNP, MSN, FNP-BC [facility] and Adult [facility] [phone] (Monday-Friday 8:00 a.m. - 5:00 p.m.) [phone] (after hours)

## 2020-08-09 LAB — COMPREHENSIVE METABOLIC PANEL
Calcium: 8.6 — AB (ref 8.7–10.7)
GFR calc Af Amer: 28.15
GFR calc non Af Amer: 24.29

## 2020-08-09 LAB — CBC: RBC: 3.08 — AB (ref 3.87–5.11)

## 2020-08-09 LAB — BASIC METABOLIC PANEL
BUN: 62 — AB (ref 4–21)
CO2: 23 — AB (ref 13–22)
Chloride: 96 — AB (ref 99–108)
Creatinine: 1.8 — AB (ref 0.5–1.1)
Glucose: 119
Potassium: 4.4 (ref 3.4–5.3)
Sodium: 133 — AB (ref 137–147)

## 2020-08-09 LAB — CBC AND DIFFERENTIAL
HCT: 26 — AB (ref 36–46)
Hemoglobin: 8.4 — AB (ref 12.0–16.0)
Neutrophils Absolute: 12.9
Platelets: 253 (ref 150–399)
WBC: 16.9

## 2020-08-12 LAB — COMPREHENSIVE METABOLIC PANEL
Calcium: 8.7 (ref 8.7–10.7)
GFR calc Af Amer: 58.38
GFR calc non Af Amer: 50.37

## 2020-08-12 LAB — CBC AND DIFFERENTIAL
HCT: 25 — AB (ref 36–46)
Hemoglobin: 8.3 — AB (ref 12.0–16.0)
Neutrophils Absolute: 8.5
Platelets: 257 (ref 150–399)
WBC: 11.4

## 2020-08-12 LAB — BASIC METABOLIC PANEL
BUN: 34 — AB (ref 4–21)
CO2: 21 (ref 13–22)
Chloride: 99 (ref 99–108)
Creatinine: 1 (ref 0.5–1.1)
Glucose: 101
Potassium: 5.1 (ref 3.4–5.3)
Sodium: 136 — AB (ref 137–147)

## 2020-08-12 LAB — CBC: RBC: 2.93 — AB (ref 3.87–5.11)

## 2020-08-20 ENCOUNTER — Non-Acute Institutional Stay (SKILLED_NURSING_FACILITY): Payer: Medicare Other | Admitting: Adult Health

## 2020-08-20 ENCOUNTER — Encounter: Payer: Self-pay | Admitting: Adult Health

## 2020-08-20 DIAGNOSIS — S72142S Displaced intertrochanteric fracture of left femur, sequela: Secondary | ICD-10-CM

## 2020-08-20 DIAGNOSIS — J9601 Acute respiratory failure with hypoxia: Secondary | ICD-10-CM

## 2020-08-20 DIAGNOSIS — D62 Acute posthemorrhagic anemia: Secondary | ICD-10-CM | POA: Diagnosis not present

## 2020-08-20 DIAGNOSIS — J189 Pneumonia, unspecified organism: Secondary | ICD-10-CM

## 2020-08-20 LAB — CBC AND DIFFERENTIAL
HCT: 27 — AB (ref 36–46)
Hemoglobin: 8.8 — AB (ref 12.0–16.0)
Neutrophils Absolute: 1.3
Platelets: 224 (ref 150–399)
WBC: 10.4

## 2020-08-20 LAB — CBC: RBC: 3.12 — AB (ref 3.87–5.11)

## 2020-08-20 NOTE — Progress Notes (Signed)
Location:  [facility] Room Number: 127-A Place of Service:  SNF (31) Provider:  Kenard Gower, DNP, FNP-BC  Patient Care Team: Pecola Lawless, MD as PCP - General (Internal Medicine) Rehab, [facility]  Extended Emergency Contact Information Primary Emergency Contact: Daleen Bo [place] Home Phone: [phone] Relation: Son Secondary Emergency Contact: Beverley Fiedler [place] Home Phone: [phone] Relation: Son  Code Status:  DNR  Goals of care: Advanced Directive information Advanced Directives 08/20/2020  Does Patient Have a Medical Advance Directive? Yes  Type of Advance Directive Out of facility DNR (pink MOST or yellow form);Healthcare Power of Attorney  Does patient want to make changes to medical advance directive? No - Patient declined  Copy of Healthcare Power of Attorney in Chart? Yes - validated most recent copy scanned in chart (See row information)  Would patient like information on creating a medical advance directive? -  Pre-existing out of facility DNR order (yellow form or pink MOST form) Yellow form placed in chart (order not valid for inpatient use)     Chief Complaint  Patient presents with   Acute Visit    Patient is seen for followup of pneumonia and anemia.     HPI:  Pt is an 86 y.o. female seen today for follow-up of pneumonia and anemia.  She is a long-term care resident of [facility].  She has a PMH of memory impairment/cognitive deficits and vitamin D deficiency.  She recently completed Rocephin x5 days for pneumonia.  Charge nurse reported that her O2 sat decreased to 89% on room air No reported fever.  She was started on O2 at 2 L/minute via [place] and her O2 sat went up to 96%.  Chest x-ray, 08/20/2020, showed left lung base infiltrate, cannot exclude pneumonia. She was seen in her room and noted. She was noted to be  alert, no noted coughing. Labs drawn today showed wbc 10.4 which is down from 16.9, hgb 8.8 which is up from 8.4. She is currently taking FeSO4  325 mg BID for anemia.  She was admitted to [facility] on 07/08/2020 post hospitalization 07/03/2020 to 07/08/2020 for an unwitnessed fall at home for which she sustained a left hip fracture for which she had a cephalic medullary nailing by orthopedics on 07/03/2020.   Past Medical History:  Diagnosis Date   Memory impairment    Past Surgical History:  Procedure Laterality Date   INTRAMEDULLARY (IM) NAIL INTERTROCHANTERIC Left 07/03/2020   Procedure: INTRAMEDULLARY (IM) NAIL INTERTROCHANTRIC;  Surgeon: Roby Lofts, MD;  [facility]: [facility] OR;  Service: Orthopedics;  Laterality: Left;   ORIF FEMUR FRACTURE Left 07/03/2020    No Known Allergies  Outpatient Encounter Medications as of 08/20/2020  Medication Sig   acetaminophen (TYLENOL) 500 MG tablet Take 500 mg by mouth every 4 (four) hours as needed. For Pain   docusate sodium (COLACE) 100 MG capsule Take 1 capsule (100 mg total) by mouth 2 (two) times daily.   ferrous sulfate (FEROSUL) 325 (65 FE) MG tablet Take 325 mg by mouth 2 (two) times daily.   furosemide (LASIX) 20 MG tablet Take 20 mg by mouth every Monday, Wednesday, and Friday.    HYDROcodone-acetaminophen (NORCO/VICODIN) 5-325 MG tablet Take 1 tablet by mouth every 12 (twelve) hours as needed. For Pain   ipratropium-albuterol (DUONEB) 0.5-2.5 (3) MG/3ML SOLN Take 3 mLs by nebulization every 6 (six) hours as needed.  Multiple Vitamin (MULTIVITAMIN WITH MINERALS) TABS tablet Take 1 tablet by mouth daily.   NON FORMULARY Med Pass to help prevent weight loss and malnutrition.   Nutritional Supplement LIQD Take 120 mLs by mouth. MedPass   Vitamin D, Ergocalciferol, (DRISDOL) 1.25 MG (50000 UNIT) CAPS capsule Take 1 capsule (50,000 Units total) by mouth every 7 (seven) days.   No  facility-administered encounter medications on file as of 08/20/2020.    Review of Systems  Unable to obtain due to cognitive impairment.   There is no immunization history on file for this patient. Pertinent  Health Maintenance Due  Topic Date Due   DEXA SCAN  Never done   PNA vac Low Risk Adult (1 of 2 - PCV13) Never done   INFLUENZA VACCINE  Never done    Vitals:   08/20/20 1433  BP: 122/65  Pulse: 70  Resp: 18  Temp: 97.8 F (36.6 C)  TempSrc: Oral  Weight: 138 lb 9.6 oz (62.9 kg)  Height: 5\' 1"  (1.549 m)   Body mass index is 26.19 kg/m.  Physical Exam  GENERAL APPEARANCE: Well nourished. In no acute distress. Normal body habitus SKIN:  Skin is warm and dry.  MOUTH and THROAT: Lips are without lesions. Oral mucosa is moist and without lesions.  RESPIRATORY: Breathing is even & unlabored, crackles on bilateral lung fields CARDIAC: RRR, no murmur,no extra heart sounds, no edema GI: Abdomen soft, normal BS, no masses, no tenderness EXTREMITIES: Able to move x4 extremities NEUROLOGICAL: There is no tremor. Speech is clear. Alert to self only. PSYCHIATRIC:  Affect and behavior are appropriate  Labs reviewed: Recent Labs    07/04/20 0943 07/05/20 0451 07/06/20 0333 08/06/20 0000 08/09/20 0000 08/12/20 0000  NA 136 135 136 135* 133* 136*  K 4.5 4.7 4.4 4.5 4.4 5.1  CL 103 104 102 99 96* 99  CO2 21* 26 27 25* 23* 21  GLUCOSE 151* 140* 129*  --   --   --   BUN 14 24* 22 20 62* 34*  CREATININE 1.18* 1.15* 1.12* 0.8 1.8* 1.0  CALCIUM 8.6* 8.5* 8.7* 9.2 8.6* 8.7    Recent Labs    07/03/20 1159 07/04/20 0943 07/05/20 0451 08/06/20 0000 08/09/20 0000 08/12/20 0000 08/20/20 0000  WBC 8.7 13.1* 11.6*   < > 16.9 11.4 10.4  NEUTROABS 7.1  --   --    < > 12.90 8.50 1.30  HGB 12.7 11.2* 9.6*   < > 8.4* 8.3* 8.8*  HCT 42.2 36.2 31.0*   < > 26* 25* 27*  MCV 90.9 88.5 89.6  --   --   --   --   PLT 234 255 181   < > 253 257 224   < > = values in this interval  not displayed.   Lab Results  Component Value Date   TSH 2.27 07/10/2020   Assessment/Plan  1. Pneumonia of left lower lobe due to infectious organism -   Completed 5 days of Rocephin -   Wbc 10.4 down from 16.9 -   No fever  2. Anemia due to acute blood loss Lab Results  Component Value Date   HGB 8.8 (A) 08/20/2020   -Continue ferrous sulfate 325 mg 1 tab BID - will add vitamin C 100 mg twice a day to increase absorption of iron  3. Closed displaced intertrochanteric fracture of left femur, sequela -  S/P cephalic medullary nailing on 08/22/2020 -   Continue PT and OT,  for therapeutic and strengthening exercises  4. Acute respiratory failure with hypoxia (HCC) -Continue O2 at 2 L/minute via [place] -Continue with ipratropium albuterol nebulization PRN    Family/ staff Communication: Discussed plan of care with resident and charge nurse.  Labs/tests ordered: CBC in 1 week  Goals of care:   Long-term care.    Kenard Gower, DNP, MSN, FNP-BC [facility] and Adult [facility] [phone] (Monday-Friday 8:00 a.m. - 5:00 p.m.) [phone] (after hours)

## 2020-08-27 LAB — CBC AND DIFFERENTIAL
HCT: 28 — AB (ref 36–46)
Hemoglobin: 9.2 — AB (ref 12.0–16.0)
Neutrophils Absolute: 5
Platelets: 209 (ref 150–399)
WBC: 7.1

## 2020-08-27 LAB — CBC: RBC: 3.33 — AB (ref 3.87–5.11)

## 2020-09-11 ENCOUNTER — Encounter: Payer: Self-pay | Admitting: Adult Health

## 2020-09-11 ENCOUNTER — Non-Acute Institutional Stay (SKILLED_NURSING_FACILITY): Payer: Medicare Other | Admitting: Adult Health

## 2020-09-11 DIAGNOSIS — D62 Acute posthemorrhagic anemia: Secondary | ICD-10-CM

## 2020-09-11 DIAGNOSIS — S72142S Displaced intertrochanteric fracture of left femur, sequela: Secondary | ICD-10-CM | POA: Diagnosis not present

## 2020-09-11 DIAGNOSIS — F0390 Unspecified dementia without behavioral disturbance: Secondary | ICD-10-CM

## 2020-09-11 NOTE — Progress Notes (Signed)
Location:  [facility] Room Number: 127 A Place of Service:  SNF (31) Provider:  Kenard Gower, DNP, FNP-BC  Patient Care Team: Pecola Lawless, MD as PCP - General (Internal Medicine) Rehab, [facility] (Skilled Nursing Facility)  Extended Emergency Contact Information Primary Emergency Contact: Daleen Bo [place] Home Phone: [phone] Relation: Son Secondary Emergency Contact: Beverley Fiedler [place] Home Phone: [phone] Relation: Son  Code Status:  DNR  Goals of care: Advanced Directive information Advanced Directives 08/20/2020  Does Patient Have a Medical Advance Directive? Yes  Type of Advance Directive Out of facility DNR (pink MOST or yellow form);Healthcare Power of Attorney  Does patient want to make changes to medical advance directive? No - Patient declined  Copy of Healthcare Power of Attorney in Chart? Yes - validated most recent copy scanned in chart (See row information)  Would patient like information on creating a medical advance directive? -  Pre-existing out of facility DNR order (yellow form or pink MOST form) Yellow form placed in chart (order not valid for inpatient use)     Chief Complaint  Patient presents with   Medical Management of Chronic Issues    SNF Routine Visit    HPI:  Pt is a 87 y.o. female seen today for medical management of chronic diseases. She is a short-term care resident of [facility]. She has a PMH of memory impairment/cognitive deficits and vitamin D deficiency. She recently completed Rocephin X 5 days for pneumonia. No cough nor fever reported. She continues to have PT, OT and ST. She had a closed displaced intertrochanteric fracture of the left femur for which she had cephalic medullary nailing on 99/37/1696. Latest hgb 9.2. She takes FeSO4 325 mg BID for anemia.   Past Medical History:  Diagnosis Date   Memory  impairment    Vitamin D deficiency    Past Surgical History:  Procedure Laterality Date   INTRAMEDULLARY (IM) NAIL INTERTROCHANTERIC Left 07/03/2020   Procedure: INTRAMEDULLARY (IM) NAIL INTERTROCHANTRIC;  Surgeon: Roby Lofts, MD;  Location: [facility] OR;  Service: Orthopedics;  Laterality: Left;   ORIF FEMUR FRACTURE Left 07/03/2020    No Known Allergies  Outpatient Encounter Medications as of 09/11/2020  Medication Sig   acetaminophen (TYLENOL) 500 MG tablet Take 500 mg by mouth every 4 (four) hours as needed. For Pain   docusate sodium (COLACE) 100 MG capsule Take 1 capsule (100 mg total) by mouth 2 (two) times daily.   ferrous sulfate (FEROSUL) 325 (65 FE) MG tablet Take 325 mg by mouth 2 (two) times daily.   furosemide (LASIX) 20 MG tablet Take 20 mg by mouth every Monday, Wednesday, and Friday.    HYDROcodone-acetaminophen (NORCO/VICODIN) 5-325 MG tablet Take 1 tablet by mouth every 12 (twelve) hours as needed. For Pain   ipratropium-albuterol (DUONEB) 0.5-2.5 (3) MG/3ML SOLN Take 3 mLs by nebulization every 6 (six) hours as needed.   Multiple Vitamin (MULTIVITAMIN WITH MINERALS) TABS tablet Take 1 tablet by mouth daily.   NON FORMULARY Med Pass to help prevent weight loss and malnutrition.   Nutritional Supplement LIQD Take 120 mLs by mouth. MedPass   Vitamin D, Ergocalciferol, (DRISDOL) 1.25 MG (50000 UNIT) CAPS capsule Take 1 capsule (50,000 Units total) by mouth every 7 (seven) days.   No facility-administered encounter medications on file as of 09/11/2020.    Review of Systems unable to obtain due to dementia.    There is no  immunization history on file for this patient. Pertinent  Health Maintenance Due  Topic Date Due   DEXA SCAN  Never done   PNA vac Low Risk Adult (1 of 2 - PCV13) Never done   INFLUENZA VACCINE  Never done   No flowsheet data found.   Vitals:   09/11/20 1547  BP: 123/70  Pulse: 92  Resp: 17  Temp: (!) 97.2 F (36.2 C)   Weight: 138 lb 9.6 oz (62.9 kg)  Height: 5\' 1"  (1.549 m)   Body mass index is 26.19 kg/m.  Physical Exam  GENERAL APPEARANCE: Well nourished. In no acute distress. Normal body habitus SKIN:  Skin is warm and dry.  MOUTH and THROAT: Lips are without lesions. Oral mucosa is moist and without lesions. Tongue is normal in shape, size, and color and without lesions RESPIRATORY: Breathing is even & unlabored, BS CTAB CARDIAC: RRR, no murmur,no extra heart sounds, no edema GI: Abdomen soft, normal BS, no masses, no tenderness EXTREMITIES:  Able to move X 4 extremities NEUROLOGICAL: There is no tremor. Speech is clear. Alert to self, disoriented to time and place. PSYCHIATRIC:  Affect and behavior are appropriate  Labs reviewed: Recent Labs    07/04/20 0943 07/05/20 0451 07/06/20 0333 08/06/20 0000 08/09/20 0000 08/12/20 0000  NA 136 135 136 135* 133* 136*  K 4.5 4.7 4.4 4.5 4.4 5.1  CL 103 104 102 99 96* 99  CO2 21* 26 27 25* 23* 21  GLUCOSE 151* 140* 129*  --   --   --   BUN 14 24* 22 20 62* 34*  CREATININE 1.18* 1.15* 1.12* 0.8 1.8* 1.0  CALCIUM 8.6* 8.5* 8.7* 9.2 8.6* 8.7    Recent Labs    07/03/20 1159 07/04/20 0943 07/05/20 0451 08/06/20 0000 08/09/20 0000 08/12/20 0000 08/20/20 0000  WBC 8.7 13.1* 11.6*   < > 16.9 11.4 10.4  NEUTROABS 7.1  --   --    < > 12.90 8.50 1.30  HGB 12.7 11.2* 9.6*   < > 8.4* 8.3* 8.8*  HCT 42.2 36.2 31.0*   < > 26* 25* 27*  MCV 90.9 88.5 89.6  --   --   --   --   PLT 234 255 181   < > 253 257 224   < > = values in this interval not displayed.   Lab Results  Component Value Date   TSH 2.27 07/10/2020    Assessment/Plan  1. Anemia due to acute blood loss -  hgb 9.2, 08/27/20 -  Stable, continue ferrous sulfate  2. Closed displaced intertrochanteric fracture of left femur, sequela - S/P cephalic medullary nailing on 08/29/20 -Continue PT and OT, for therapeutic strengthening exercises -Continue PRN acetaminophen and PRN  Norco for pain  3. Dementia without behavioral disturbance, unspecified dementia type (HCC) -  BIMS score 6/15, ranging in severe cognitive impairment -Continue supportive care      Family/ staff Communication: Discussed plan of care with resident and charge nurse.  Labs/tests ordered: None  Goals of care:   Short-term care   7/15, DNP, MSN, FNP-BC [facility] and Adult Medicine [phone] (Monday-Friday 8:00 a.m. - 5:00 p.m.) [phone] (after hours)

## 2020-10-10 ENCOUNTER — Encounter: Payer: Self-pay | Admitting: Adult Health

## 2020-10-10 ENCOUNTER — Non-Acute Institutional Stay (SKILLED_NURSING_FACILITY): Payer: Medicare Other | Admitting: Adult Health

## 2020-10-10 DIAGNOSIS — L89610 Pressure ulcer of right heel, unstageable: Secondary | ICD-10-CM

## 2020-10-10 DIAGNOSIS — D62 Acute posthemorrhagic anemia: Secondary | ICD-10-CM

## 2020-10-10 DIAGNOSIS — F0390 Unspecified dementia without behavioral disturbance: Secondary | ICD-10-CM | POA: Diagnosis not present

## 2020-10-10 NOTE — Progress Notes (Signed)
Location:  [facility] Room Number: 127-A Place of Service:  SNF (31) Provider:  Kenard Gower, DNP, FNP-BC  Patient Care Team: Pecola Lawless, MD as PCP - General (Internal Medicine) Rehab, [facility] ([facility] Nursing Facility)  Extended Emergency Contact Information Primary Emergency Contact: Daleen Bo [place] Home Phone: [phone] Relation: Son Secondary Emergency Contact: Beverley Fiedler [place] Home Phone: [phone] Relation: Son  Code Status:  DNR   Goals of care: Advanced Directive information Advanced Directives 10/10/2020  Does Patient Have a Medical Advance Directive? Yes  Type of Advance Directive Healthcare Power of Attorney  Does patient want to make changes to medical advance directive? No - Patient declined  Copy of Healthcare Power of Attorney in Chart? -  Would patient like information on creating a medical advance directive? -  Pre-existing out of facility DNR order (yellow form or pink MOST form) -     Chief Complaint  Patient presents with  . Medical Management of Chronic Issues    Patient is seen for a routine [facility] visit    HPI:  Pt is a 87 y.o. female seen today for medical management of chronic diseases.  She is a long-term care resident of [facility].  She has a PMH of memory impairment/cognitive deficits and vitamin D deficiency. Latest weight 137.4 lbs, gained 1.2 lb in 1 month.  She takes Magic cup daily and med Pass 120 mL twice a day for supplementation. Meal intake is 25 to 50%. Right heel has unstageable pressure ulcer and treatment done twice a week. She is being followed up by wound consultant.   Past Medical History:  Diagnosis Date  . Memory impairment   . Vitamin D deficiency    Past Surgical History:  Procedure Laterality Date  . INTRAMEDULLARY (IM) NAIL INTERTROCHANTERIC Left 07/03/2020   Procedure: INTRAMEDULLARY  (IM) NAIL INTERTROCHANTRIC;  Surgeon: Roby Lofts, MD;  Location: [facility] OR;  Service: Orthopedics;  Laterality: Left;  . ORIF FEMUR FRACTURE Left 07/03/2020    No Known Allergies  Outpatient Encounter Medications as of 10/10/2020  Medication Sig  . acetaminophen (TYLENOL) 500 MG tablet Take 500 mg by mouth every 4 (four) hours as needed. For Pain  . docusate sodium (COLACE) 100 MG capsule Take 1 capsule (100 mg total) by mouth 2 (two) times daily.  . ferrous sulfate 325 (65 FE) MG tablet Take 325 mg by mouth 2 (two) times daily.  . furosemide (LASIX) 20 MG tablet Take 20 mg by mouth every Monday, Wednesday, and Friday.   Marland Kitchen HYDROcodone-acetaminophen (NORCO/VICODIN) 5-325 MG tablet Take 1 tablet by mouth every 12 (twelve) hours as needed. For Pain  . Multiple Vitamin (MULTIVITAMIN WITH MINERALS) TABS tablet Take 1 tablet by mouth daily.  . NON FORMULARY Take 1 each by mouth daily. Magic Cup  . Nutritional Supplement LIQD Take 120 mLs by mouth. MedPass  . Vitamin D, Ergocalciferol, (DRISDOL) 1.25 MG (50000 UNIT) CAPS capsule Take 1 capsule (50,000 Units total) by mouth every 7 (seven) days.  . [DISCONTINUED] ipratropium-albuterol (DUONEB) 0.5-2.5 (3) MG/3ML SOLN Take 3 mLs by nebulization every 6 (six) hours as needed.   No facility-administered encounter medications on file as of 10/10/2020.    Review of Systems  GENERAL: No fever or chills  MOUTH and THROAT: Denies oral discomfort, gingival pain or bleeding RESPIRATORY: no cough, SOB, DOE, wheezing, hemoptysis CARDIAC: No chest pain, edema or palpitations GI: No abdominal pain, diarrhea,  constipation, heart burn, nausea or vomiting GU: Denies dysuria, frequency, hematuria or discharge NEUROLOGICAL: Denies dizziness, syncope, numbness, or headache PSYCHIATRIC: Denies feelings of depression or anxiety. No report of hallucinations, insomnia, paranoia, or agitation    There is no immunization history on file for this patient. Pertinent   Health Maintenance Due  Topic Date Due  . DEXA SCAN  Never done  . PNA vac Low Risk Adult (1 of 2 - PCV13) Never done  . INFLUENZA VACCINE  Never done    Vitals:   10/10/20 1517  BP: (!) 144/78  Pulse: 75  Resp: 20  Temp: 97.7 F (36.5 C)  TempSrc: Oral  Weight: 137 lb 6.4 oz (62.3 kg)  Height: 5\' 1"  (1.549 m)   Body mass index is 25.96 kg/m.  Physical Exam  GENERAL APPEARANCE: Well nourished. In no acute distress. Normal body habitus SKIN:  Right heel with black necrotic tissue, 2 X 1 X 0.5 mm MOUTH and THROAT: Lips are without lesions. Oral mucosa is moist and without lesions.  RESPIRATORY: Breathing is even & unlabored, BS CTAB CARDIAC: RRR, no murmur,no extra heart sounds, no edema GI: Abdomen soft, normal BS, no masses, no tenderness NEUROLOGICAL: There is no tremor. Speech is clear. Alert to self, disoriented to time and place. PSYCHIATRIC:  Affect and behavior are appropriate  Labs reviewed: Recent Labs    07/04/20 0943 07/05/20 0451 07/06/20 0333 08/06/20 0000 08/09/20 0000 08/12/20 0000  NA 136 135 136 135* 133* 136*  K 4.5 4.7 4.4 4.5 4.4 5.1  CL 103 104 102 99 96* 99  CO2 21* 26 27 25* 23* 21  GLUCOSE 151* 140* 129*  --   --   --   BUN 14 24* 22 20 62* 34*  CREATININE 1.18* 1.15* 1.12* 0.8 1.8* 1.0  CALCIUM 8.6* 8.5* 8.7* 9.2 8.6* 8.7    Recent Labs    07/03/20 1159 07/04/20 0943 07/05/20 0451 08/06/20 0000 08/12/20 0000 08/20/20 0000 08/27/20 0000  WBC 8.7 13.1* 11.6*   < > 11.4 10.4 7.1  NEUTROABS 7.1  --   --    < > 8.50 1.30 5.00  HGB 12.7 11.2* 9.6*   < > 8.3* 8.8* 9.2*  HCT 42.2 36.2 31.0*   < > 25* 27* 28*  MCV 90.9 88.5 89.6  --   --   --   --   PLT 234 255 181   < > 257 224 209   < > = values in this interval not displayed.   Lab Results  Component Value Date   TSH 2.27 07/10/2020    Assessment/Plan  1. Pressure ulcer of right heel, unstageable (HCC) -  Treatment done twice a week with Betadine and foam protective  dressing with offloading boots and pressure relieving mattress -   Followed up by wound consultant  2. Anemia due to acute blood loss Lab Results  Component Value Date   HGB 9.2 (A) 08/27/2020   -Continue ferrous sulfate  3. Dementia without behavioral disturbance, unspecified dementia type (HCC) -   Continue supportive care and fall precautions     Family/ staff Communication: Discussed plan of care with resident and charge nurse.  Labs/tests ordered: None  Goals of care:   Long-term care   08/29/2020, DNP, MSN, FNP-BC [facility] and Adult [facility] [phone] (Monday-Friday 8:00 a.m. - 5:00 p.m.) [phone] (after hours)

## 2020-10-28 ENCOUNTER — Encounter: Payer: Self-pay | Admitting: Adult Health

## 2020-10-28 ENCOUNTER — Non-Acute Institutional Stay (SKILLED_NURSING_FACILITY): Payer: Medicare Other | Admitting: Adult Health

## 2020-10-28 DIAGNOSIS — R1312 Dysphagia, oropharyngeal phase: Secondary | ICD-10-CM | POA: Diagnosis not present

## 2020-10-28 DIAGNOSIS — J189 Pneumonia, unspecified organism: Secondary | ICD-10-CM

## 2020-10-28 NOTE — Progress Notes (Signed)
Location:  [facility] Room Number: 127 A Place of Service:  SNF (31) Provider:  Kenard Gower, DNP, FNP-BC  Patient Care Team: Pecola Lawless, MD as PCP - General (Internal Medicine) Rehab, [facility] ([facility] Facility)  Extended Emergency Contact Information Primary Emergency Contact: Daleen Bo [place] Home Phone: [phone] Relation: Son Secondary Emergency Contact: Beverley Fiedler [place] Home Phone: [phone] Relation: Son  Code Status:  DNR  Goals of care: Advanced Directive information Advanced Directives 10/10/2020  Does Patient Have a Medical Advance Directive? Yes  Type of Advance Directive Healthcare Power of Attorney  Does patient want to make changes to medical advance directive? No - Patient declined  Copy of Healthcare Power of Attorney in Chart? -  Would patient like information on creating a medical advance directive? -  Pre-existing out of facility DNR order (yellow form or pink MOST form) -     Chief Complaint  Patient presents with  . Acute Visit    Possible aspiration     HPI:  Samantha Hickman is a 87 y.o. female seen today for coughing while eating. No fever nor chills. She has O2 @ 2L/min via  continuously. Chest x-ray revealed patchy left basilar airspace opacities. She is a long-term care resident of [facility]. She has a PMH of memory impairment/cognitive deficits and vitamin D deficiency.   Past Medical History:  Diagnosis Date  . Memory impairment   . Vitamin D deficiency    Past Surgical History:  Procedure Laterality Date  . INTRAMEDULLARY (IM) NAIL INTERTROCHANTERIC Left 07/03/2020   Procedure: INTRAMEDULLARY (IM) NAIL INTERTROCHANTRIC;  Surgeon: Roby Lofts, MD;  Location: [facility] OR;  Service: Orthopedics;  Laterality: Left;  . ORIF FEMUR FRACTURE Left 07/03/2020    No Known Allergies  Outpatient Encounter Medications as of  10/28/2020  Medication Sig  . acetaminophen (TYLENOL) 500 MG tablet Take 500 mg by mouth every 4 (four) hours as needed. For Pain  . ascorbic acid (VITAMIN C) 500 MG tablet Take 500 mg by mouth 2 (two) times daily.  Marland Kitchen docusate sodium (COLACE) 100 MG capsule Take 1 capsule (100 mg total) by mouth 2 (two) times daily.  . ferrous sulfate 325 (65 FE) MG tablet Take 325 mg by mouth 2 (two) times daily.  . furosemide (LASIX) 20 MG tablet Take 20 mg by mouth every Monday, Wednesday, and Friday.   Marland Kitchen HYDROcodone-acetaminophen (NORCO/VICODIN) 5-325 MG tablet Take 1 tablet by mouth every 12 (twelve) hours as needed. For Pain  . Multiple Vitamin (MULTIVITAMIN WITH MINERALS) TABS tablet Take 1 tablet by mouth daily.  . NON FORMULARY Take 1 each by mouth daily. Magic Cup  . Nutritional Supplement LIQD Take 120 mLs by mouth. MedPass  . Vitamin D, Ergocalciferol, (DRISDOL) 1.25 MG (50000 UNIT) CAPS capsule Take 1 capsule (50,000 Units total) by mouth every 7 (seven) days.   No facility-administered encounter medications on file as of 10/28/2020.    Review of Systems  GENERAL: No fever or chills  MOUTH and THROAT: Denies oral discomfort, gingival pain or bleeding RESPIRATORY: + cough CARDIAC: No chest pain, edema or palpitations GI: No abdominal pain, diarrhea, constipation, heart burn, nausea or vomiting GU: Denies dysuria, frequency, hematuria or discharge NEUROLOGICAL: Denies dizziness, syncope, numbness, or headache PSYCHIATRIC: Denies feelings of depression or anxiety. No report of hallucinations, insomnia, paranoia, or agitation    There is no immunization history on file for this patient. Pertinent  Health  Maintenance Due  Topic Date Due  . DEXA SCAN  Never done  . PNA vac Low Risk Adult (1 of 2 - PCV13) Never done  . INFLUENZA VACCINE  Never done   No flowsheet data found.   Vitals:   10/28/20 1130  BP: 116/70  Pulse: 96  Resp: 17  Temp: (!) 97.5 F (36.4 C)  Weight: 135 lb (61.2  kg)  Height: 5\' 1"  (1.549 m)   Body mass index is 25.51 kg/m.  Physical Exam  GENERAL APPEARANCE: Well nourished. In no acute distress. Normal body habitus SKIN:  Has unstageable right heel pressure ulcer MOUTH and THROAT: Lips are without lesions. Oral mucosa is moist and without lesions.  RESPIRATORY: Breathing is even & unlabored, BS CTAB CARDIAC: RRR, no murmur,no extra heart sounds, no edema GI: Abdomen soft, normal BS, no masses, no tenderness NEUROLOGICAL: There is no tremor. Speech is clear. Alert to self, disoriented to time and place. PSYCHIATRIC:  Affect and behavior are appropriate  Labs reviewed: Recent Labs    07/04/20 0943 07/05/20 0451 07/06/20 0333 08/06/20 0000 08/09/20 0000 08/12/20 0000  NA 136 135 136 135* 133* 136*  K 4.5 4.7 4.4 4.5 4.4 5.1  CL 103 104 102 99 96* 99  CO2 21* 26 27 25* 23* 21  GLUCOSE 151* 140* 129*  --   --   --   BUN 14 24* 22 20 62* 34*  CREATININE 1.18* 1.15* 1.12* 0.8 1.8* 1.0  CALCIUM 8.6* 8.5* 8.7* 9.2 8.6* 8.7    Recent Labs    07/03/20 1159 07/04/20 0943 07/05/20 0451 08/06/20 0000 08/12/20 0000 08/20/20 0000 08/27/20 0000  WBC 8.7 13.1* 11.6*   < > 11.4 10.4 7.1  NEUTROABS 7.1  --   --    < > 8.50 1.30 5.00  HGB 12.7 11.2* 9.6*   < > 8.3* 8.8* 9.2*  HCT 42.2 36.2 31.0*   < > 25* 27* 28*  MCV 90.9 88.5 89.6  --   --   --   --   PLT 234 255 181   < > 257 224 209   < > = values in this interval not displayed.   Lab Results  Component Value Date   TSH 2.27 07/10/2020    Assessment/Plan  1. Pneumonia of left lower lobe due to infectious organism -  Will start on Doxycycline 100 mg 1 total PO BID x10 days and Florastor 250 mg 1 capsule PO BID x13 days -Guaifenesin 100 mg / 5 mL give 10 mL= 200 mg PO @ 6AM, 2PM and 10 PM x1 week  2.  Dysphagia -  Will continue pure, nectar thick liquids -   Aspiration precautions    Family/ staff Communication: Discussed plan of care with resident and charge  nurse.  Labs/tests ordered: None  Goals of care:   Long-term care   13/11/2019, DNP, MSN, FNP-BC [facility] and Adult Medicine [phone] (Monday-Friday 8:00 a.m. - 5:00 p.m.) [phone] (after hours)

## 2020-10-31 LAB — COMPREHENSIVE METABOLIC PANEL
Calcium: 9 (ref 8.7–10.7)
GFR calc Af Amer: 62.06
GFR calc non Af Amer: 53.55

## 2020-10-31 LAB — BASIC METABOLIC PANEL
BUN: 26 — AB (ref 4–21)
CO2: 26 — AB (ref 13–22)
Chloride: 100 (ref 99–108)
Creatinine: 0.9 (ref <=1.1)
Glucose: 161
Potassium: 3.7 (ref 3.4–5.3)
Sodium: 139 (ref 137–147)

## 2020-11-08 ENCOUNTER — Non-Acute Institutional Stay (SKILLED_NURSING_FACILITY): Payer: Medicare Other | Admitting: Adult Health

## 2020-11-08 DIAGNOSIS — J189 Pneumonia, unspecified organism: Secondary | ICD-10-CM | POA: Diagnosis not present

## 2020-11-08 DIAGNOSIS — L89610 Pressure ulcer of right heel, unstageable: Secondary | ICD-10-CM | POA: Diagnosis not present

## 2020-11-08 DIAGNOSIS — D62 Acute posthemorrhagic anemia: Secondary | ICD-10-CM

## 2020-11-08 DIAGNOSIS — F0390 Unspecified dementia without behavioral disturbance: Secondary | ICD-10-CM

## 2020-11-08 DIAGNOSIS — N1831 Chronic kidney disease, stage 3a: Secondary | ICD-10-CM | POA: Diagnosis not present

## 2020-11-08 NOTE — Progress Notes (Signed)
Location:  [facility] Room Number: 127A Place of Service:  SNF (31) Provider:  Kenard Gower, DNP, FNP-BC  Patient Care Team: Pecola Lawless, MD as PCP - General (Internal Medicine) Rehab, [facility])  Extended Emergency Contact Information Primary Emergency Contact: Daleen Bo [place] Home Phone: [phone] Relation: Son Secondary Emergency Contact: Beverley Fiedler [place] Home Phone: [phone] Relation: Son  Code Status:  DNR  Goals of care: Advanced Directive information Advanced Directives 10/10/2020  Does Patient Have a Medical Advance Directive? Yes  Type of Advance Directive Healthcare Power of Attorney  Does patient want to make changes to medical advance directive? No - Patient declined  Copy of Healthcare Power of Attorney in Chart? -  Would patient like information on creating a medical advance directive? -  Pre-existing out of facility DNR order (yellow form or pink MOST form) -     Chief Complaint  Patient presents with  . Medical Management of Chronic Issues    HPI:  Pt is a 87 y.o. female seen today for medical management of chronic diseases. She is a long-term care resident of [facility] She has a PMH of memory impairment/cognitive deficits and vitamin D deficiency. She continues to take Doxycycline for LLL pneumonia. No reported side effects. No fever. Latest creatinine 0.94 and GFR 53. 10/31/20. Latest hgb 9.2, mcv 85.1. She takes  FeSO4 325 mg BID for anemia.   Past Medical History:  Diagnosis Date  . Memory impairment   . Vitamin D deficiency    Past Surgical History:  Procedure Laterality Date  . INTRAMEDULLARY (IM) NAIL INTERTROCHANTERIC Left 07/03/2020   Procedure: INTRAMEDULLARY (IM) NAIL INTERTROCHANTRIC;  Surgeon: Roby Lofts, MD;  Location: [facility] OR;  Service: Orthopedics;  Laterality: Left;  . ORIF FEMUR  FRACTURE Left 07/03/2020    No Known Allergies  Outpatient Encounter Medications as of 11/08/2020  Medication Sig  . acetaminophen (TYLENOL) 500 MG tablet Take 500 mg by mouth every 4 (four) hours as needed. For Pain  . ascorbic acid (VITAMIN C) 500 MG tablet Take 500 mg by mouth 2 (two) times daily.  Marland Kitchen docusate sodium (COLACE) 100 MG capsule Take 1 capsule (100 mg total) by mouth 2 (two) times daily.  . ferrous sulfate 325 (65 FE) MG tablet Take 325 mg by mouth 2 (two) times daily.  . furosemide (LASIX) 20 MG tablet Take 20 mg by mouth every Monday, Wednesday, and Friday.   Marland Kitchen HYDROcodone-acetaminophen (NORCO/VICODIN) 5-325 MG tablet Take 1 tablet by mouth every 12 (twelve) hours as needed. For Pain  . Multiple Vitamin (MULTIVITAMIN WITH MINERALS) TABS tablet Take 1 tablet by mouth daily.  . NON FORMULARY Take 1 each by mouth daily. Magic Cup  . Nutritional Supplement LIQD Take 120 mLs by mouth. MedPass  . Vitamin D, Ergocalciferol, (DRISDOL) 1.25 MG (50000 UNIT) CAPS capsule Take 1 capsule (50,000 Units total) by mouth every 7 (seven) days.   No facility-administered encounter medications on file as of 11/08/2020.    Review of Systems unable to obtain due to dementia    There is no immunization history on file for this patient. Pertinent  Health Maintenance Due  Topic Date Due  . DEXA SCAN  Never done  . PNA vac Low Risk Adult (1 of 2 - PCV13) Never done  . INFLUENZA VACCINE  Never done   No flowsheet data found.   Vitals:   11/08/20 1328  BP: 128/79  Pulse: 86  Resp: 18  Temp: (!) 97.5 F (36.4 C)  Weight: 137 lb 6.4 oz (62.3 kg)  Height: 5\' 1"  (1.549 m)   Body mass index is 25.96 kg/m.  Physical Exam  GENERAL APPEARANCE: Well nourished. In no acute distress. Normal body habitus SKIN:  Has pressure ulcer on right heel with dressing MOUTH and THROAT: Lips are without lesions. Oral mucosa is moist and without lesions.  RESPIRATORY: Breathing is even & unlabored, BS  CTAB CARDIAC: RRR, no murmur,no extra heart sounds, no edema GI: Abdomen soft, normal BS, no masses, no tenderness NEUROLOGICAL: There is no tremor. Speech is clear. Alert to self, disoriented to time and place. PSYCHIATRIC:  Affect and behavior are appropriate  Labs reviewed: Recent Labs    07/04/20 0943 07/05/20 0451 07/06/20 0333 08/06/20 0000 08/09/20 0000 08/12/20 0000  NA 136 135 136 135* 133* 136*  K 4.5 4.7 4.4 4.5 4.4 5.1  CL 103 104 102 99 96* 99  CO2 21* 26 27 25* 23* 21  GLUCOSE 151* 140* 129*  --   --   --   BUN 14 24* 22 20 62* 34*  CREATININE 1.18* 1.15* 1.12* 0.8 1.8* 1.0  CALCIUM 8.6* 8.5* 8.7* 9.2 8.6* 8.7    Recent Labs    07/03/20 1159 07/04/20 0943 07/05/20 0451 08/06/20 0000 08/12/20 0000 08/20/20 0000 08/27/20 0000  WBC 8.7 13.1* 11.6*   < > 11.4 10.4 7.1  NEUTROABS 7.1  --   --    < > 8.50 1.30 5.00  HGB 12.7 11.2* 9.6*   < > 8.3* 8.8* 9.2*  HCT 42.2 36.2 31.0*   < > 25* 27* 28*  MCV 90.9 88.5 89.6  --   --   --   --   PLT 234 255 181   < > 257 224 209   < > = values in this interval not displayed.   Lab Results  Component Value Date   TSH 2.27 07/10/2020     Assessment/Plan  1. Chronic kidney disease, stage 3a (HCC) Lab Results  Component Value Date   NA 136 (A) 08/12/2020   K 5.1 08/12/2020   CO2 21 08/12/2020   GLUCOSE 129 (H) 07/06/2020   BUN 34 (A) 08/12/2020   CREATININE 1.0 08/12/2020   CALCIUM 8.7 08/12/2020   GFRNONAA 50.37 08/12/2020   GFRAA 58.38 08/12/2020   -  stable  2. Anemia due to acute blood loss Lab Results  Component Value Date   HGB 9.2 (A) 08/27/2020   -Continue ferrous sulfate and recheck CBC  3. Pressure ulcer of right heel, unstageable (HCC) -   Continue wound treatment daily, vitamin C, multivitamins with minerals, Magic cup and med Pass for supplementation  4. Pneumonia of left lower lobe due to infectious organism -Continue doxycycline for total of 10 days  5. Dementia without behavioral  disturbance, unspecified dementia type (HCC) -   BIMS score 6/15, ranging in severe cognitive impairment -   Continue supportive care     Family/ staff Communication:   Labs/tests ordered: CBC  Goals of care:      7/15, DNP, MSN, FNP-BC [facility] [phone] (Monday-Friday 8:00 a.m. - 5:00 p.m.) [phone] (after hours)

## 2020-11-09 IMAGING — DX DG HIP (WITH OR WITHOUT PELVIS) 2-3V*L*
3 series · 3 of 3 positions shown · non-contrast
Comparison: None.

CLINICAL DATA: Left hip pain

EXAM:
DG HIP (WITH OR WITHOUT PELVIS) 2-3V LEFT

[hip ap]
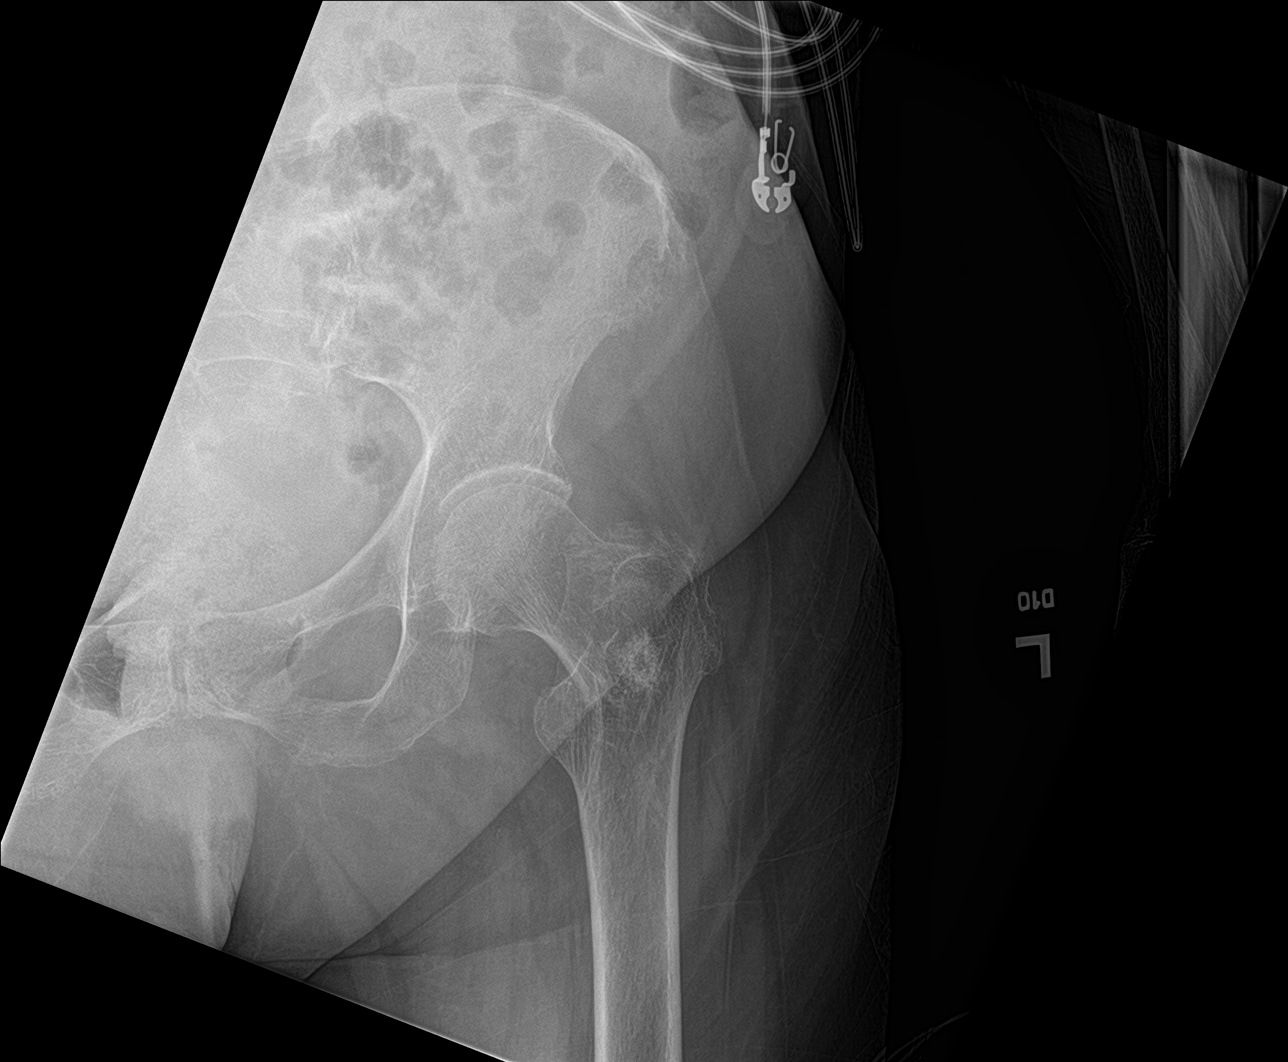

[hip lat]
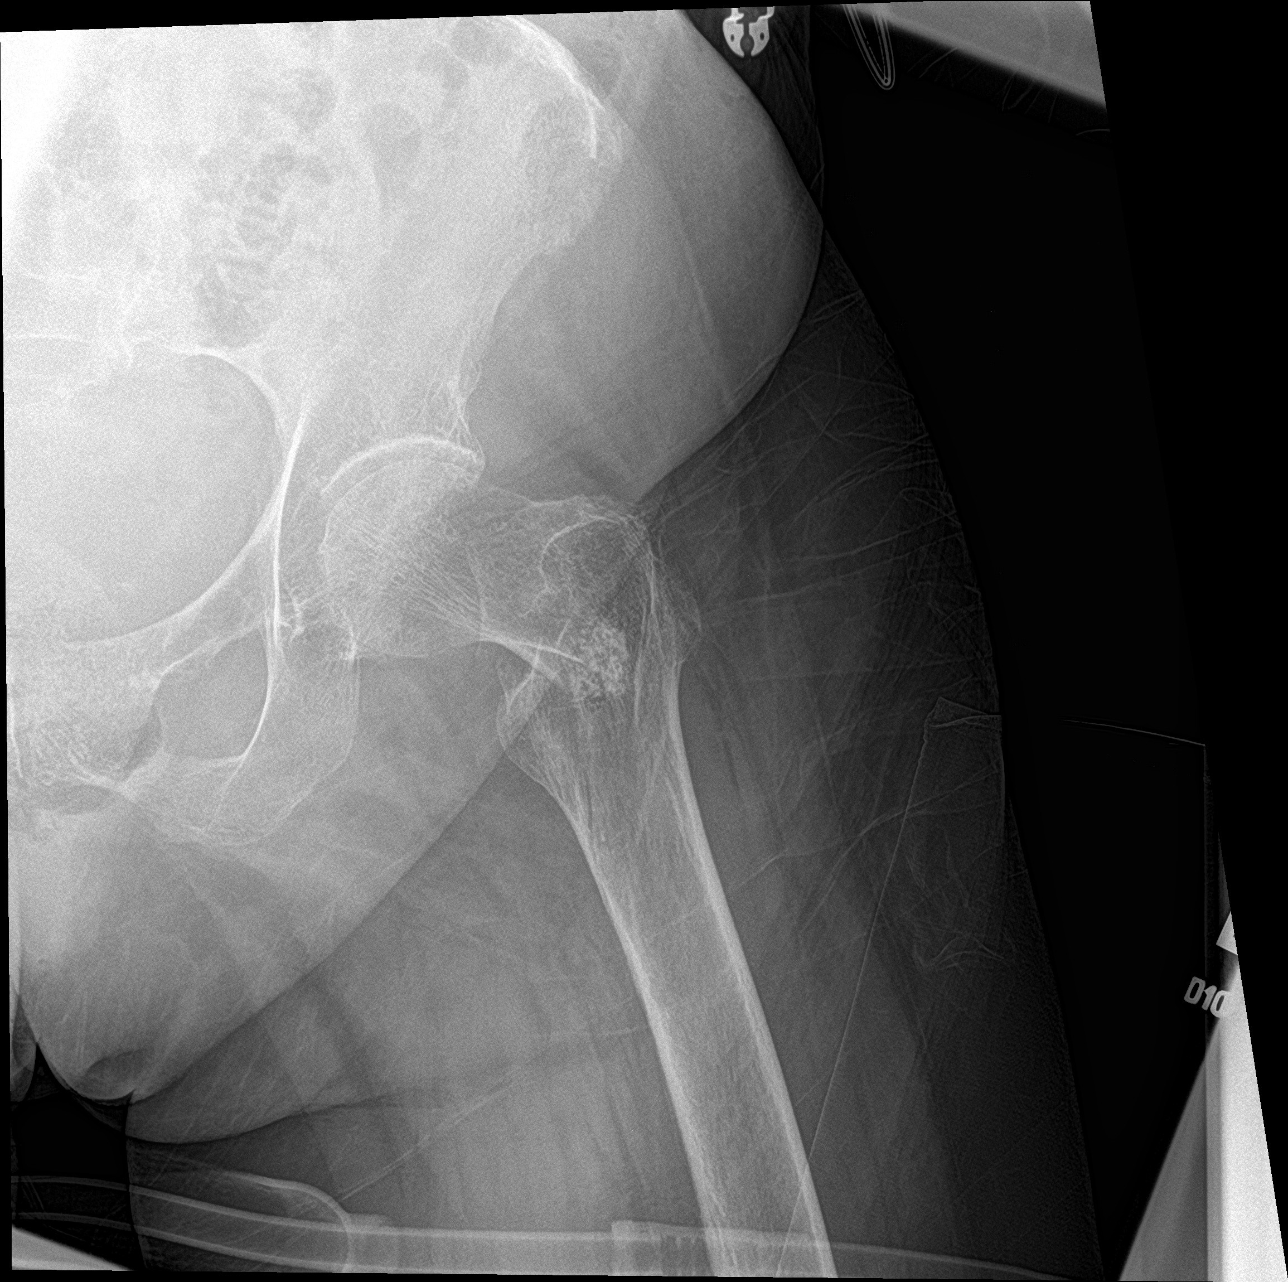

[pelvis ap]
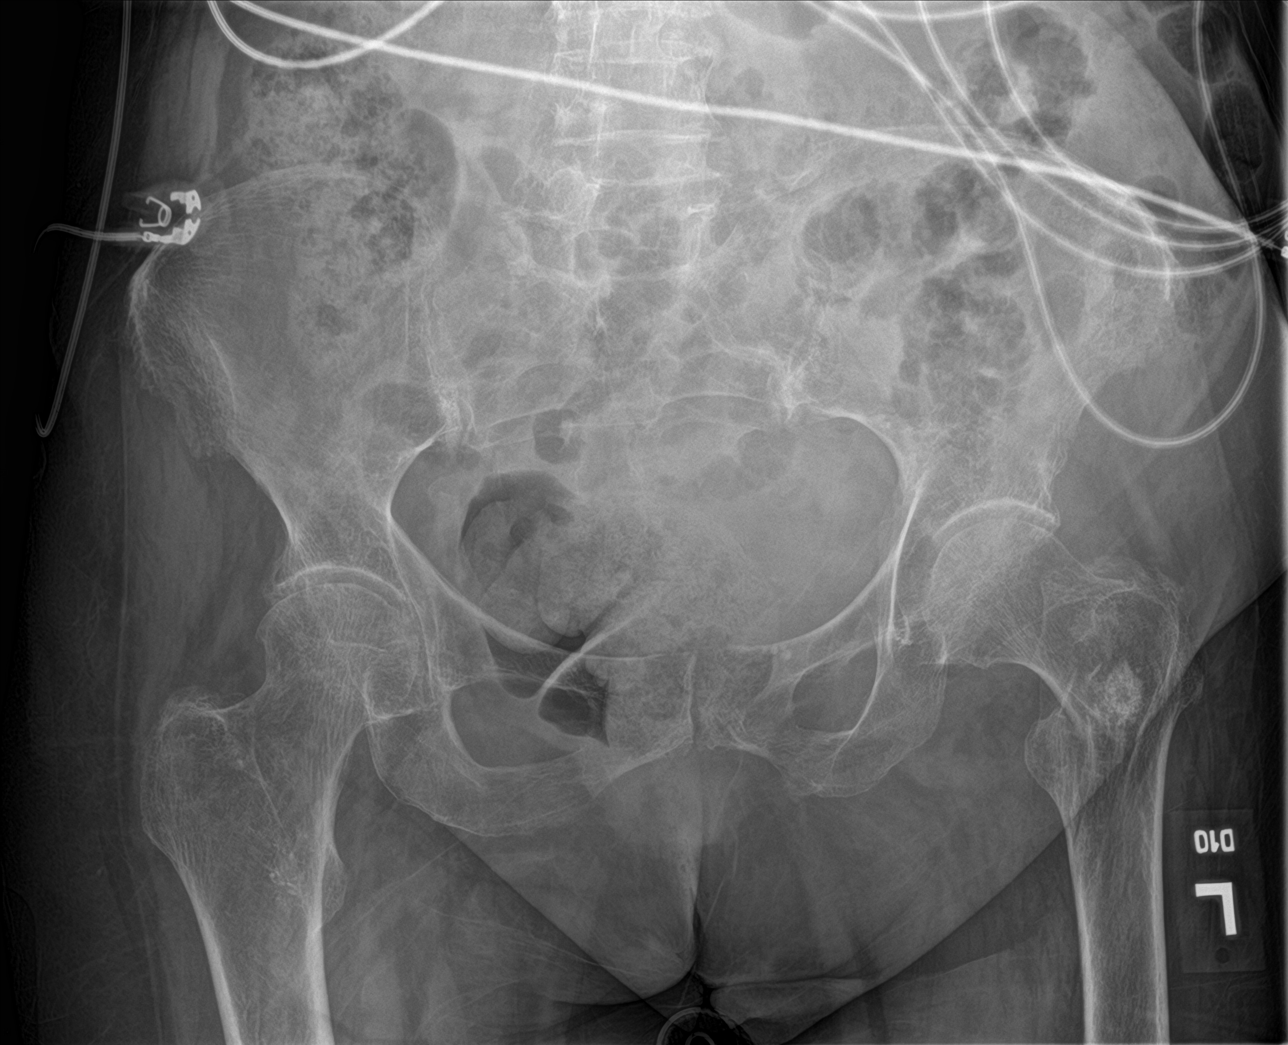

[3 of 3 positions shown; findings below may reference images not displayed]

FINDINGS: Diffuse osseous demineralization. Acute intertrochanteric fracture
of the proximal left femur with slight varus angulation. There is a
rounded 1.9 cm nonspecific sclerotic area within the
intertrochanteric aspect of the femur. No additional sclerotic or
lytic bony abnormalities are seen within the left femur or
visualized pelvis.
IMPRESSION: 1. Acute intertrochanteric fracture of the proximal left femur with
slight varus angulation.
2. Nonspecific 1.9 cm sclerotic area within the intertrochanteric
aspect of the left femur. Although this appearance could be
posttraumatic, an underlying bone lesion is not excluded.
Correlation with prior outside imaging, if available, which includes
the proximal femur is recommended.

## 2020-11-09 IMAGING — RF DG HIP (WITH PELVIS) OPERATIVE*L*
1 series · 6 of 6 positions shown · non-contrast
Comparison: Hip radiographs 07/03/2020

CLINICAL DATA: Open reduction internal fixation.

EXAM:
OPERATIVE LEFT HIP (WITH PELVIS IF PERFORMED) 6 VIEWS
TECHNIQUE: Fluoroscopic spot image(s) were submitted for interpretation
post-operatively.

[Series 1: run · 6 of 6 slices shown]
[im 1/6]
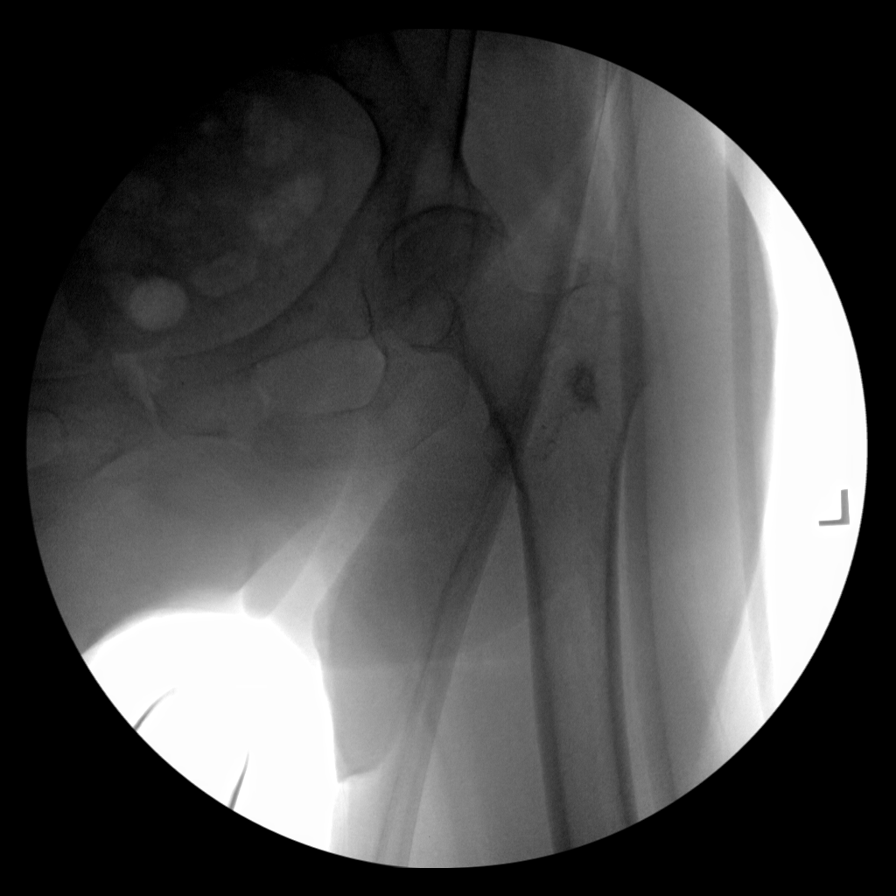
[im 2/6]
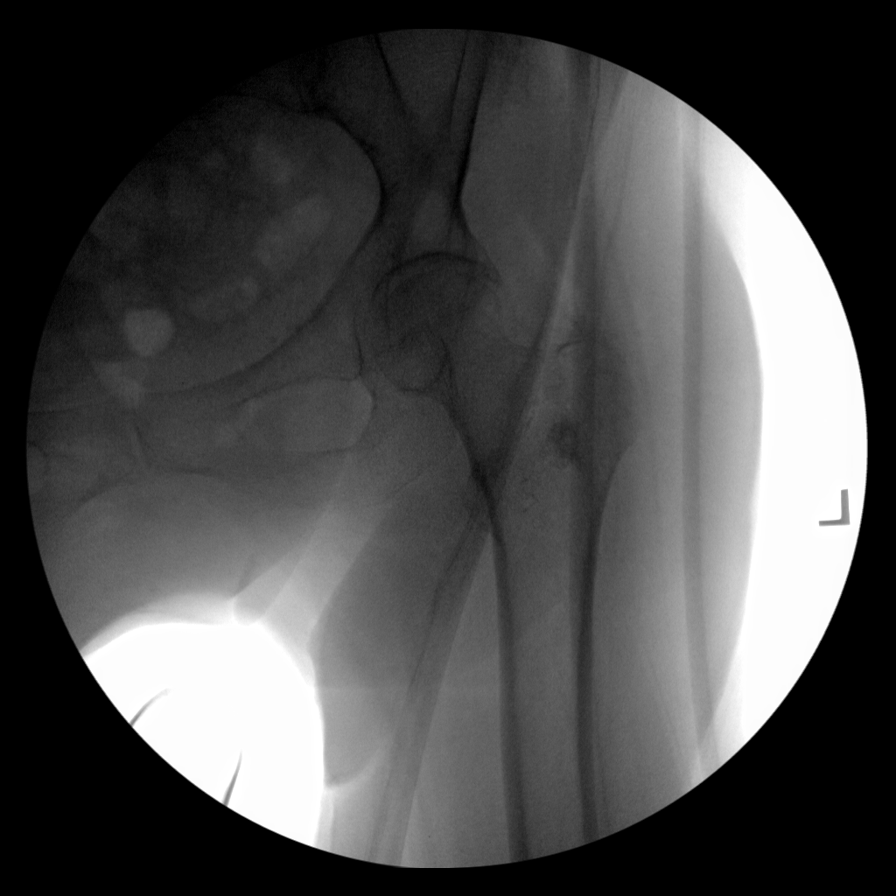
[im 3/6]
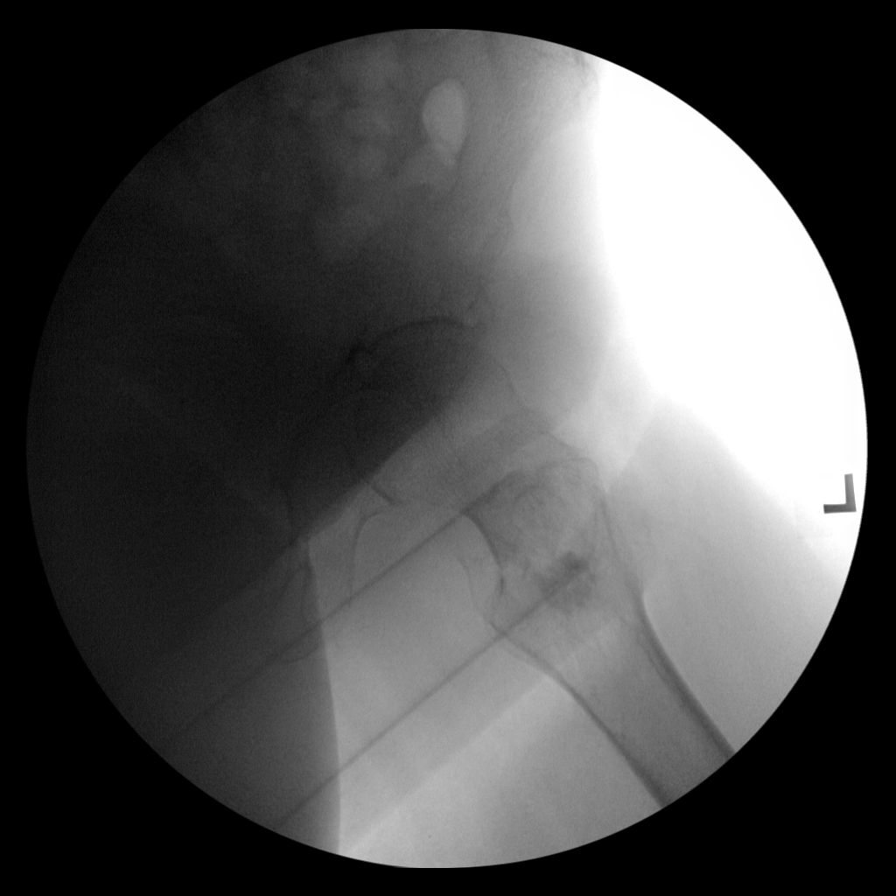
[im 4/6]
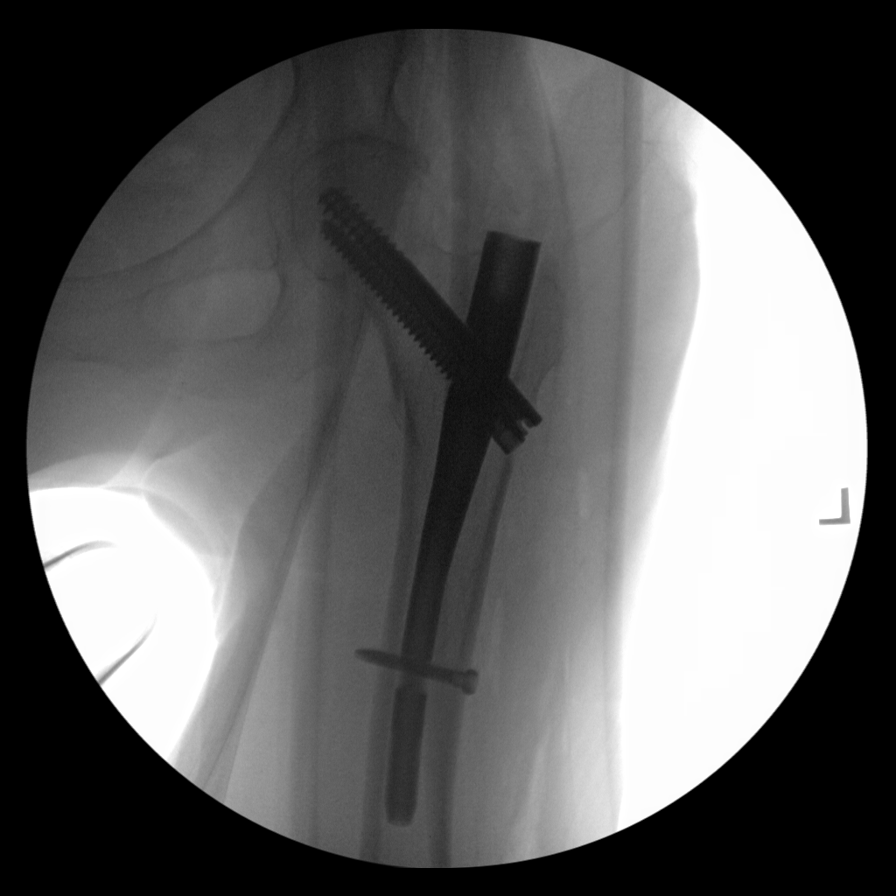
[im 5/6]
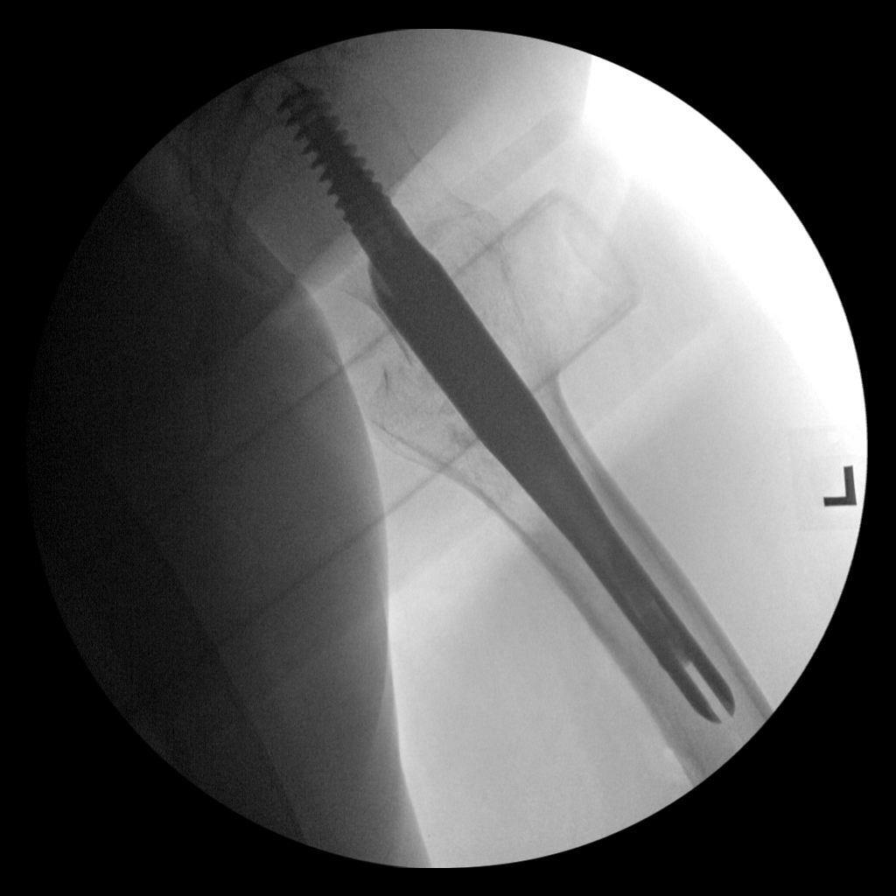
[im 6/6]
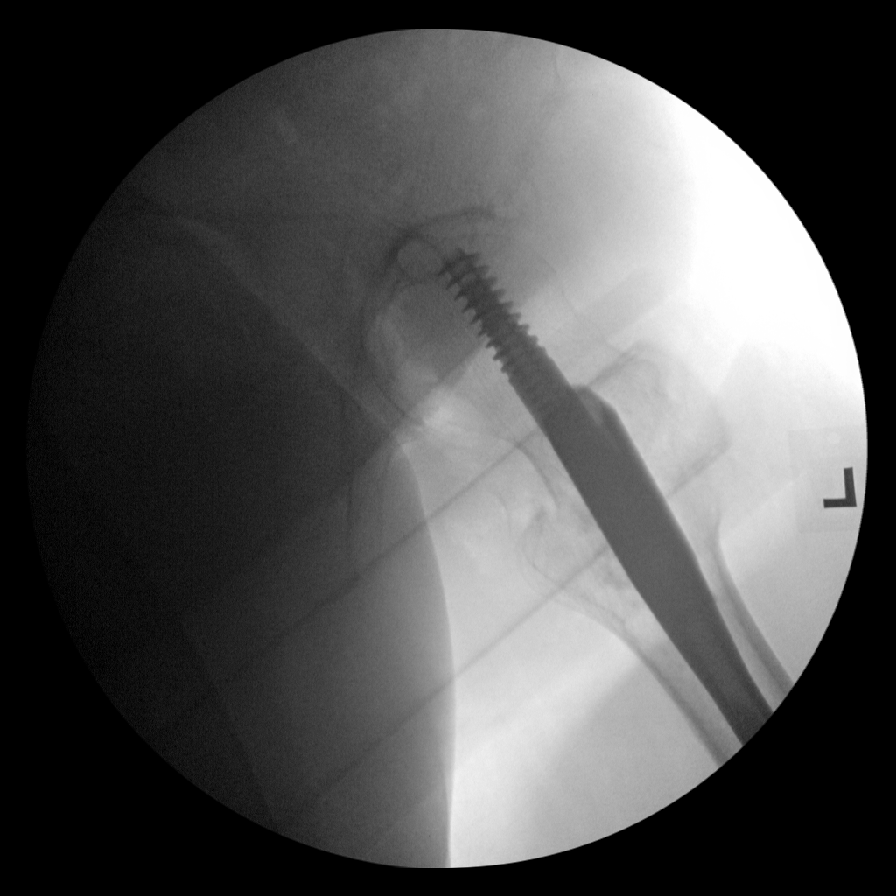

[6 of 6 positions shown; findings below may reference images not displayed]

FINDINGS: Fluoroscopic time: 1 minutes and 28 seconds.

Six C-arm fluoroscopic images were obtained intraoperatively and
submitted for post operative interpretation. These images
demonstrate cephalomedullary nail fixation of a left
intertrochanteric femur fracture with improved, near anatomic
alignment. Please see the performing provider's procedural report
for further detail.
IMPRESSION: Intraoperative fluoroscopic imaging, as detailed above.

## 2020-11-11 ENCOUNTER — Encounter: Payer: Self-pay | Admitting: Adult Health

## 2020-11-12 LAB — CBC AND DIFFERENTIAL
HCT: 31 — AB (ref 36–46)
Hemoglobin: 10 — AB (ref 12.0–16.0)
Platelets: 249 (ref 150–399)
WBC: 7.7

## 2020-11-12 LAB — CBC: RBC: 3.66 — AB (ref 3.87–5.11)

## 2020-11-15 LAB — CBC AND DIFFERENTIAL
HCT: 28 — AB (ref 36–46)
Hemoglobin: 9.2 — AB (ref 12.0–16.0)
Platelets: 235 (ref 150–399)
WBC: 7.5

## 2020-11-15 LAB — CBC: RBC: 3.35 — AB (ref 3.87–5.11)

## 2020-12-09 ENCOUNTER — Encounter: Payer: Self-pay | Admitting: Adult Health

## 2020-12-09 ENCOUNTER — Non-Acute Institutional Stay (SKILLED_NURSING_FACILITY): Payer: Medicare Other | Admitting: Adult Health

## 2020-12-09 DIAGNOSIS — L89613 Pressure ulcer of right heel, stage 3: Secondary | ICD-10-CM | POA: Diagnosis not present

## 2020-12-09 DIAGNOSIS — D509 Iron deficiency anemia, unspecified: Secondary | ICD-10-CM | POA: Diagnosis not present

## 2020-12-09 DIAGNOSIS — R600 Localized edema: Secondary | ICD-10-CM | POA: Diagnosis not present

## 2020-12-09 DIAGNOSIS — F0390 Unspecified dementia without behavioral disturbance: Secondary | ICD-10-CM | POA: Diagnosis not present

## 2020-12-09 NOTE — Progress Notes (Signed)
Location:  [facility] Room Number: 127/A Place of Service:  SNF (31) Provider:  Kenard Gower, DNP, FNP-BC  Patient Care Team: Pecola Lawless, MD as PCP - General (Internal Medicine) Rehab, [facility])  Extended Emergency Contact Information Primary Emergency Contact: Samantha Hickman [place] Home Phone: [phone] Relation: Son Secondary Emergency Contact: Samantha Hickman [place] Home Phone: [phone] Relation: Son  Code Status:    DNR  Goals of care: Advanced Directive information Advanced Directives 12/09/2020  Does Patient Have a Medical Advance Directive? Yes  Type of Estate agent of [place];Out of facility DNR (pink MOST or yellow form)  Does patient want to make changes to medical advance directive? No - Patient declined  Copy of Healthcare Power of Attorney in Chart? Yes - validated most recent copy scanned in chart (See row information)  Would patient like information on creating a medical advance directive? -  Pre-existing out of facility DNR order (yellow form or pink MOST form) Yellow form placed in chart (order not valid for inpatient use)     Chief Complaint  Patient presents with  . Medical Management of Chronic Issues    Routine Visit of Medical Management     HPI:  Pt is a 87 y.o. female seen today for medical management of chronic diseases. She is a long-term care resident of [facility]. She has a PMH of memory impairment/cognitive deficits and vitamin D deficiency. She was seen in her room today. Right heel wound has moderate serous drainage. Treatment with aquacel Ag done. Latest hgb 9.2, 11/15/20. She takes FeSO4  325 mg BID for anemia.  Past Medical History:  Diagnosis Date  . Memory impairment   . Vitamin D deficiency    Past Surgical History:  Procedure Laterality Date  . INTRAMEDULLARY (IM) NAIL  INTERTROCHANTERIC Left 07/03/2020   Procedure: INTRAMEDULLARY (IM) NAIL INTERTROCHANTRIC;  Surgeon: Roby Lofts, MD;  Location: [facility] OR;  Service: Orthopedics;  Laterality: Left;  . ORIF FEMUR FRACTURE Left 07/03/2020    No Known Allergies  Outpatient Encounter Medications as of 12/09/2020  Medication Sig  . acetaminophen (TYLENOL) 500 MG tablet Take 500 mg by mouth every 4 (four) hours as needed. For Pain  . ascorbic acid (VITAMIN C) 500 MG tablet Take 500 mg by mouth 2 (two) times daily.  Marland Kitchen docusate sodium (COLACE) 100 MG capsule Take 1 capsule (100 mg total) by mouth 2 (two) times daily.  . ferrous sulfate 325 (65 FE) MG tablet Take 325 mg by mouth 2 (two) times daily.  . furosemide (LASIX) 20 MG tablet Take 20 mg by mouth every Monday, Wednesday, and Friday.   Marland Kitchen HYDROcodone-acetaminophen (NORCO/VICODIN) 5-325 MG tablet Take 1 tablet by mouth every 12 (twelve) hours as needed. For Pain  . Multiple Vitamin (MULTIVITAMIN WITH MINERALS) TABS tablet Take 1 tablet by mouth daily.  . NON FORMULARY Take 1 each by mouth daily. Magic Cup  . Nutritional Supplement LIQD Take 120 mLs by mouth. MedPass  . OXYGEN Inhale 2 L into the lungs continuous.  . Vitamin D, Ergocalciferol, (DRISDOL) 1.25 MG (50000 UNIT) CAPS capsule Take 1 capsule (50,000 Units total) by mouth every 7 (seven) days.   No facility-administered encounter medications on file as of 12/09/2020.    Review of Systems  Unable to obtain due to dementia   Pertinent  Health Maintenance Due  Topic Date Due  . DEXA SCAN  Never  done  . PNA vac Low Risk Adult (1 of 2 - PCV13) Never done  . INFLUENZA VACCINE  04/07/2021   No flowsheet data found.   Vitals:   12/09/20 1445  BP: 116/60  Pulse: 84  Resp: 16  Temp: (!) 97.3 F (36.3 C)  SpO2: 95%  Weight: 134 lb 9.6 oz (61.1 kg)  Height: 5\' 1"  (1.549 m)   Body mass index is 25.43 kg/m.  Physical Exam  GENERAL APPEARANCE: Well nourished. In no acute distress. Normal body  habitus SKIN:  Right heel pressure ulcer stage III MOUTH and THROAT: Lips are without lesions. Oral mucosa is moist and without lesions. RESPIRATORY: Breathing is even & unlabored, BS CTAB CARDIAC: RRR, no murmur,no extra heart sounds, no edema GI: Abdomen soft, normal BS, no masses, no tenderness NEUROLOGICAL: There is no tremor. Speech is clear. Alert to self, disoriented to time and place. PSYCHIATRIC:  Affect and behavior are appropriate  Labs reviewed: Recent Labs    07/04/20 0943 07/05/20 0451 07/06/20 0333 08/06/20 0000 08/09/20 0000 08/12/20 0000 10/31/20 0000  NA 136 135 136   < > 133* 136* 139  K 4.5 4.7 4.4   < > 4.4 5.1 3.7  CL 103 104 102   < > 96* 99 100  CO2 21* 26 27   < > 23* 21 26*  GLUCOSE 151* 140* 129*  --   --   --   --   BUN 14 24* 22   < > 62* 34* 26*  CREATININE 1.18* 1.15* 1.12*   < > 1.8* 1.0 0.9  CALCIUM 8.6* 8.5* 8.7*   < > 8.6* 8.7 9.0   < > = values in this interval not displayed.   No results for input(s): AST, ALT, ALKPHOS, BILITOT, PROT, ALBUMIN in the last 8760 hours. Recent Labs    07/03/20 1159 07/04/20 0943 07/05/20 0451 08/06/20 0000 08/12/20 0000 08/20/20 0000 08/27/20 0000 11/12/20 0000 11/15/20 0000  WBC 8.7 13.1* 11.6*   < > 11.4 10.4 7.1 7.7 7.5  NEUTROABS 7.1  --   --    < > 8.50 1.30 5.00  --   --   HGB 12.7 11.2* 9.6*   < > 8.3* 8.8* 9.2* 10.0* 9.2*  HCT 42.2 36.2 31.0*   < > 25* 27* 28* 31* 28*  MCV 90.9 88.5 89.6  --   --   --   --   --   --   PLT 234 255 181   < > 257 224 209 249 235   < > = values in this interval not displayed.   Lab Results  Component Value Date   TSH 2.27 07/10/2020   No results found for: HGBA1C No results found for: CHOL, HDL, LDLCALC, LDLDIRECT, TRIG, CHOLHDL   Assessment/Plan  1. Iron deficiency anemia, unspecified iron deficiency anemia type Lab Results  Component Value Date   HGB 9.2 (A) 11/15/2020   -Stable, continue ferrous sulfate  2. Pressure ulcer of right heel, stage III  (HCC) -   Treated with Aquacel Ag, air mattress, frequent repositioning/offloading   3. Lower extremity edema -Continue Lasix  4. Dementia without behavioral disturbance, unspecified dementia type (HCC) -   Continue supportive care -    fall precautions     Family/ staff Communication: Discussed plan of care with resident and charge nurse.  Labs/tests ordered: None  Goals of care:   Long-term care   01/15/2021, DNP, MSN, FNP-BC [facility] [phone] (Monday-Friday 8:00 a.m. - 5:00 p.m.) [phone] (after hours)

## 2020-12-27 ENCOUNTER — Encounter: Payer: Self-pay | Admitting: Adult Health

## 2020-12-27 ENCOUNTER — Non-Acute Institutional Stay (SKILLED_NURSING_FACILITY): Payer: Medicare Other | Admitting: Adult Health

## 2020-12-27 DIAGNOSIS — D509 Iron deficiency anemia, unspecified: Secondary | ICD-10-CM | POA: Diagnosis not present

## 2020-12-27 DIAGNOSIS — L609 Nail disorder, unspecified: Secondary | ICD-10-CM | POA: Diagnosis not present

## 2020-12-27 DIAGNOSIS — L89613 Pressure ulcer of right heel, stage 3: Secondary | ICD-10-CM | POA: Diagnosis not present

## 2020-12-27 NOTE — Progress Notes (Signed)
Location:  [facility] Room Number: 127/A Place of Service:  SNF (31) Provider:  Kenard Gower, DNP, FNP-BC  Patient Care Team: Pecola Lawless, MD as PCP - General (Internal Medicine) Rehab, [facility])  Extended Emergency Contact Information Primary Emergency Contact: Daleen Bo [place] Home Phone: [phone] Relation: Son Secondary Emergency Contact: Beverley Fiedler [place] Home Phone: [phone] Relation: Son  Code Status: DNR   Goals of care: Advanced Directive information Advanced Directives 12/27/2020  Does Patient Have a Medical Advance Directive? Yes  Type of Estate agent of [place];Out of facility DNR (pink MOST or yellow form)  Does patient want to make changes to medical advance directive? No - Patient declined  Copy of Healthcare Power of Attorney in Chart? Yes - validated most recent copy scanned in chart (See row information)  Would patient like information on creating a medical advance directive? -  Pre-existing out of facility DNR order (yellow form or pink MOST form) Yellow form placed in chart (order not valid for inpatient use)     Chief Complaint  Patient presents with  . Acute Visit    Loose Big Toenail    HPI:  Pt is a 87 y.o. female seen today for loose left big toenail. Dry blood noted. No erythema on left big toe. Toenail is thick and yellowish. No reported trauma nor fever. She has a right heel pressure ulcer which is being treated daily with aqaucel Ag and foam dressing. She takes Vitamin C 500 mg BID, Multivitamins with minerals 1 tab daily, magic cup BID and 120 ml medpass BID for wound healing supplementation. She takes FeSO4 325 mg BID for anemia. Latest hgb 9.2, 11/15/20.  Resident is a long-term care resident of [facility]. She has a PMH of memory impairment/cognitive deficits and vitamin D  deficiency.   Past Medical History:  Diagnosis Date  . Memory impairment   . Vitamin D deficiency    Past Surgical History:  Procedure Laterality Date  . INTRAMEDULLARY (IM) NAIL INTERTROCHANTERIC Left 07/03/2020   Procedure: INTRAMEDULLARY (IM) NAIL INTERTROCHANTRIC;  Surgeon: Roby Lofts, MD;  Location: [facility] OR;  Service: Orthopedics;  Laterality: Left;  . ORIF FEMUR FRACTURE Left 07/03/2020    No Known Allergies  Outpatient Encounter Medications as of 12/27/2020  Medication Sig  . acetaminophen (TYLENOL) 500 MG tablet Take 500 mg by mouth every 4 (four) hours as needed. For Pain  . ascorbic acid (VITAMIN C) 500 MG tablet Take 500 mg by mouth 2 (two) times daily.  Marland Kitchen docusate sodium (COLACE) 100 MG capsule Take 1 capsule (100 mg total) by mouth 2 (two) times daily.  . ferrous sulfate 325 (65 FE) MG tablet Take 325 mg by mouth 2 (two) times daily.  . furosemide (LASIX) 20 MG tablet Take 20 mg by mouth every Monday, Wednesday, and Friday.   Marland Kitchen HYDROcodone-acetaminophen (NORCO/VICODIN) 5-325 MG tablet Take 1 tablet by mouth every 12 (twelve) hours as needed. For Pain  . Multiple Vitamin (MULTIVITAMIN WITH MINERALS) TABS tablet Take 1 tablet by mouth daily.  . NON FORMULARY Take 1 each by mouth 2 (two) times daily. Magic Cup  . Nutritional Supplement LIQD Take 120 mLs by mouth. MedPass  . OXYGEN Inhale 2 L into the lungs continuous.  . Vitamin D, Ergocalciferol, (DRISDOL) 1.25 MG (50000 UNIT) CAPS capsule Take 1 capsule (50,000 Units total) by mouth every 7 (seven) days.   No  facility-administered encounter medications on file as of 12/27/2020.    Review of Systems  Unable to obtain due to dementia    There is no immunization history on file for this patient. Pertinent  Health Maintenance Due  Topic Date Due  . DEXA SCAN  Never done  . PNA vac Low Risk Adult (1 of 2 - PCV13) Never done  . INFLUENZA VACCINE  04/07/2021   No flowsheet data found.   Vitals:   12/27/20 1037   BP: 126/66  Pulse: 84  Resp: 20  Temp: (!) 97.5 F (36.4 C)  SpO2: 95%  Weight: 127 lb 6.4 oz (57.8 kg)  Height: 5\' 1"  (1.549 m)   Body mass index is 24.07 kg/m.  Physical Exam  GENERAL APPEARANCE: Well nourished. In no acute distress. Normal body habitus SKIN:  See HPI; right heel pressure ulcer stage III MOUTH and THROAT: Lips are without lesions. Oral mucosa is moist and without lesions.  RESPIRATORY: Breathing is even & unlabored, BS CTAB CARDIAC: RRR, no murmur,no extra heart sounds NEUROLOGICAL: There is no tremor. Speech is clear. Alert to self, disoriented to time and place. PSYCHIATRIC:  Affect and behavior are appropriate  Labs reviewed: Recent Labs    07/04/20 0943 07/05/20 0451 07/06/20 0333 08/06/20 0000 08/09/20 0000 08/12/20 0000 10/31/20 0000  NA 136 135 136   < > 133* 136* 139  K 4.5 4.7 4.4   < > 4.4 5.1 3.7  CL 103 104 102   < > 96* 99 100  CO2 21* 26 27   < > 23* 21 26*  GLUCOSE 151* 140* 129*  --   --   --   --   BUN 14 24* 22   < > 62* 34* 26*  CREATININE 1.18* 1.15* 1.12*   < > 1.8* 1.0 0.9  CALCIUM 8.6* 8.5* 8.7*   < > 8.6* 8.7 9.0   < > = values in this interval not displayed.   No results for input(s): AST, ALT, ALKPHOS, BILITOT, PROT, ALBUMIN in the last 8760 hours. Recent Labs    07/03/20 1159 07/04/20 0943 07/05/20 0451 08/06/20 0000 08/12/20 0000 08/20/20 0000 08/27/20 0000 11/12/20 0000 11/15/20 0000  WBC 8.7 13.1* 11.6*   < > 11.4 10.4 7.1 7.7 7.5  NEUTROABS 7.1  --   --    < > 8.50 1.30 5.00  --   --   HGB 12.7 11.2* 9.6*   < > 8.3* 8.8* 9.2* 10.0* 9.2*  HCT 42.2 36.2 31.0*   < > 25* 27* 28* 31* 28*  MCV 90.9 88.5 89.6  --   --   --   --   --   --   PLT 234 255 181   < > 257 224 209 249 235   < > = values in this interval not displayed.   Lab Results  Component Value Date   TSH 2.27 07/10/2020    Assessment/Plan  1. Loose toenail -  Treatment to left big toe daily -  Monitor for infection -  Refer for podiatry  consult  2. Stage III pressure ulcer of right heel (HCC) - continue treatment daily -  Continue Vitamin C, Multivitamins with minerals daily, medpass and magic cup for supplementation  3. Iron deficiency anemia, unspecified iron deficiency anemia type Lab Results  Component Value Date   HGB 9.2 (A) 11/15/2020   -  Continue FeSO4    Family/ staff Communication:   Discussed plan of care with  charge nurse.  Labs/tests ordered:  None  Goals of care:   Long-term care   Kenard Gower, DNP, MSN, FNP-BC [facility] [phone] (Monday-Friday 8:00 a.m. - 5:00 p.m.) [phone] (after hours)

## 2021-01-21 ENCOUNTER — Non-Acute Institutional Stay (SKILLED_NURSING_FACILITY): Payer: Medicare Other | Admitting: Adult Health

## 2021-01-21 ENCOUNTER — Encounter: Payer: Self-pay | Admitting: Adult Health

## 2021-01-21 DIAGNOSIS — D509 Iron deficiency anemia, unspecified: Secondary | ICD-10-CM

## 2021-01-21 DIAGNOSIS — L89613 Pressure ulcer of right heel, stage 3: Secondary | ICD-10-CM

## 2021-01-21 DIAGNOSIS — F0390 Unspecified dementia without behavioral disturbance: Secondary | ICD-10-CM | POA: Diagnosis not present

## 2021-01-21 DIAGNOSIS — N1831 Chronic kidney disease, stage 3a: Secondary | ICD-10-CM

## 2021-01-21 NOTE — Progress Notes (Signed)
Location:  [facility] Room Number: 127-A Place of Service:  SNF (31) Provider:  Kenard Gower, DNP, FNP-BC  Patient Care Team: Pecola Lawless, MD as PCP - General (Internal Medicine) Rehab, [facility] (Skilled Nursing Facility)  Extended Emergency Contact Information Primary Emergency Contact: Daleen Bo [place] Home Phone: [phone] Relation: Son Secondary Emergency Contact: Beverley Fiedler [place] Home Phone: [phone] Relation: Son  Code Status:   DNR  Goals of care: Advanced Directive information Advanced Directives 01/21/2021  Does Patient Have a Medical Advance Directive? Yes  Type of Estate agent of [place];Out of facility DNR (pink MOST or yellow form)  Does patient want to make changes to medical advance directive? No - Patient declined  Copy of Healthcare Power of Attorney in Chart? Yes - validated most recent copy scanned in chart (See row information)  Would patient like information on creating a medical advance directive? -  Pre-existing out of facility DNR order (yellow form or pink MOST form) -     Chief Complaint  Patient presents with  . Medical Management of Chronic Issues    Routine Visit    HPI:  Pt is a 87 y.o. female seen today for medical management of chronic diseases.She is a long-term care resident of [facility].  She has a PMH of memory impairment/cognitive deficits and vitamin D deficiency. Latest GFR 5.55 and creatinine 0.94. Right heel pressure ulcer stage 3 treatment done daily. Latest BIMS score  3/15, ranging in severe cognitive impairment.   Past Medical History:  Diagnosis Date  . Memory impairment   . Vitamin D deficiency    Past Surgical History:  Procedure Laterality Date  . INTRAMEDULLARY (IM) NAIL INTERTROCHANTERIC Left 07/03/2020   Procedure: INTRAMEDULLARY (IM) NAIL INTERTROCHANTRIC;  Surgeon:  Roby Lofts, MD;  Location: [facility] OR;  Service: Orthopedics;  Laterality: Left;  . ORIF FEMUR FRACTURE Left 07/03/2020    No Known Allergies  Outpatient Encounter Medications as of 01/21/2021  Medication Sig  . acetaminophen (TYLENOL) 500 MG tablet Take 500 mg by mouth every 4 (four) hours as needed. For Pain  . ascorbic acid (VITAMIN C) 500 MG tablet Take 500 mg by mouth 2 (two) times daily.  Marland Kitchen docusate sodium (COLACE) 100 MG capsule Take 1 capsule (100 mg total) by mouth 2 (two) times daily.  . ferrous sulfate 325 (65 FE) MG tablet Take 325 mg by mouth 2 (two) times daily.  Marland Kitchen HYDROcodone-acetaminophen (NORCO/VICODIN) 5-325 MG tablet Take 1 tablet by mouth every 12 (twelve) hours as needed. For Pain  . Multiple Vitamin (MULTIVITAMIN WITH MINERALS) TABS tablet Take 1 tablet by mouth daily.  . NON FORMULARY Take 1 each by mouth 2 (two) times daily. Magic Cup  . Nutritional Supplements (ENSURE PO) Take 237 mLs by mouth 2 (two) times daily.  . OXYGEN Inhale 2 L into the lungs continuous.  . Vitamin D, Ergocalciferol, (DRISDOL) 1.25 MG (50000 UNIT) CAPS capsule Take 1 capsule (50,000 Units total) by mouth every 7 (seven) days.  . [DISCONTINUED] furosemide (LASIX) 20 MG tablet Take 20 mg by mouth every Monday, Wednesday, and Friday.   . [DISCONTINUED] Nutritional Supplement LIQD Take 120 mLs by mouth. MedPass   No facility-administered encounter medications on file as of 01/21/2021.    Review of Systems  Unable to obtain due to dementia   There is no immunization history on file for this patient. Pertinent  Health Maintenance Due  Topic Date Due  . DEXA SCAN  Never done  . PNA vac Low Risk Adult (1 of 2 - PCV13) Never done  . INFLUENZA VACCINE  04/07/2021   No flowsheet data found.   Vitals:   01/21/21 1532  BP: 121/66  Pulse: 98  Resp: 17  Temp: 97.7 F (36.5 C)  Height: 5\' 1"  (1.549 m)   Body mass index is 24.07 kg/m.  Physical Exam  GENERAL APPEARANCE: Well nourished.  In no acute distress. Normal body habitus SKIN:  Right heel pressure ulcer stage III MOUTH and THROAT: Lips are without lesions. Oral mucosa is moist and without lesions.  RESPIRATORY: Breathing is even & unlabored, BS CTAB CARDIAC: RRR, no murmur,no extra heart sounds, no edema GI: Abdomen soft, normal BS, no masses, no tenderness NEUROLOGICAL: There is no tremor.Alert to self, disoriented to time and place. PSYCHIATRIC:  Affect and behavior are appropriate  Labs reviewed: Recent Labs    07/04/20 0943 07/05/20 0451 07/06/20 0333 08/06/20 0000 08/09/20 0000 08/12/20 0000 10/31/20 0000  NA 136 135 136   < > 133* 136* 139  K 4.5 4.7 4.4   < > 4.4 5.1 3.7  CL 103 104 102   < > 96* 99 100  CO2 21* 26 27   < > 23* 21 26*  GLUCOSE 151* 140* 129*  --   --   --   --   BUN 14 24* 22   < > 62* 34* 26*  CREATININE 1.18* 1.15* 1.12*   < > 1.8* 1.0 0.9  CALCIUM 8.6* 8.5* 8.7*   < > 8.6* 8.7 9.0   < > = values in this interval not displayed.    Recent Labs    07/03/20 1159 07/04/20 0943 07/05/20 0451 08/06/20 0000 08/12/20 0000 08/20/20 0000 08/27/20 0000 11/12/20 0000 11/15/20 0000  WBC 8.7 13.1* 11.6*   < > 11.4 10.4 7.1 7.7 7.5  NEUTROABS 7.1  --   --    < > 8.50 1.30 5.00  --   --   HGB 12.7 11.2* 9.6*   < > 8.3* 8.8* 9.2* 10.0* 9.2*  HCT 42.2 36.2 31.0*   < > 25* 27* 28* 31* 28*  MCV 90.9 88.5 89.6  --   --   --   --   --   --   PLT 234 255 181   < > 257 224 209 249 235   < > = values in this interval not displayed.   Lab Results  Component Value Date   TSH 2.27 07/10/2020    Assessment/Plan  1. 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 Iron deficiency anemia, unspecified iron deficiency anemia type Lab Results  Component Value Date   HGB 9.2 (A) 11/15/2020   -   Continue ferrous sulfate  3. Chronic kidney disease, stage 3a [facility]) Lab Results  Component Value Date   NA 139 10/31/2020    K 3.7 10/31/2020   CO2 26 (A) 10/31/2020   GLUCOSE 129 (H) 07/06/2020   BUN 26 (A) 10/31/2020   CREATININE 0.9 10/31/2020   CALCIUM 9.0 10/31/2020   GFRNONAA 53.55 10/31/2020   GFRAA 62.06 10/31/2020   -   stable  4. Dementia without behavioral disturbance, unspecified dementia type (HCC) -  Continue  supportive care -  Fall precautions    Family/ staff Communication: [place] plan of care with charge nurse.  Labs/tests ordered: None  Goals of care:   Long-term care   Kenard Gower, DNP, MSN, FNP-BC [facility] and Adult Medicine [phone] (Monday-Friday 8:00 a.m. - 5:00 p.m.) [phone] (after hours)

## 2021-02-14 ENCOUNTER — Non-Acute Institutional Stay (SKILLED_NURSING_FACILITY): Payer: Medicare Other | Admitting: Adult Health

## 2021-02-14 ENCOUNTER — Encounter: Payer: Self-pay | Admitting: Adult Health

## 2021-02-14 DIAGNOSIS — R630 Anorexia: Secondary | ICD-10-CM | POA: Diagnosis not present

## 2021-02-14 DIAGNOSIS — R634 Abnormal weight loss: Secondary | ICD-10-CM

## 2021-02-14 DIAGNOSIS — L89613 Pressure ulcer of right heel, stage 3: Secondary | ICD-10-CM

## 2021-02-14 NOTE — Progress Notes (Signed)
Location:  [facility] Room Number: 127-A Place of Service:  SNF (31) Provider:  Kenard Gower, DNP, FNP-BC  Patient Care Team: Pecola Lawless, MD as PCP - General (Internal Medicine) Rehab, [facility])  Extended Emergency Contact Information Primary Emergency Contact: Daleen Bo [place] Home Phone: [phone] Relation: Son Secondary Emergency Contact: Beverley Fiedler [place] Home Phone: [phone] Relation: Son  Code Status:  DR  Goals of care: Advanced Directive information Advanced Directives 02/14/2021  Does Patient Have a Medical Advance Directive? Yes  Type of Estate agent of [place];Out of facility DNR (pink MOST or yellow form)  Does patient want to make changes to medical advance directive? No - Patient declined  Copy of Healthcare Power of Attorney in Chart? Yes - validated most recent copy scanned in chart (See row information)  Would patient like information on creating a medical advance directive? -  Pre-existing out of facility DNR order (yellow form or pink MOST form) -     Chief Complaint  Patient presents with   Acute Visit    Significant weight loss.     HPI:  Pt is a 87 y.o. female who has been having weight loss. She is a long-term care resident of [facility]. She has an intake of 25 - 50%. Latest weight is 121.6 lbs, down 4.85% in 32 days, down 11.5% in 94 days and down 12.27% 182 days. She is participating in restorative dining.   Past Medical History:  Diagnosis Date   Memory impairment    Vitamin D deficiency    Past Surgical History:  Procedure Laterality Date   INTRAMEDULLARY (IM) NAIL INTERTROCHANTERIC Left 07/03/2020   Procedure: INTRAMEDULLARY (IM) NAIL INTERTROCHANTRIC;  Surgeon: Roby Lofts, MD;  Location: [facility] OR;  Service: Orthopedics;  Laterality: Left;   ORIF FEMUR FRACTURE Left  07/03/2020    No Known Allergies  Outpatient Encounter Medications as of 02/14/2021  Medication Sig   acetaminophen (TYLENOL) 500 MG tablet Take 500 mg by mouth every 4 (four) hours as needed. For Pain   ascorbic acid (VITAMIN C) 500 MG tablet Take 500 mg by mouth 2 (two) times daily.   docusate sodium (COLACE) 100 MG capsule Take 1 capsule (100 mg total) by mouth 2 (two) times daily.   ferrous sulfate 325 (65 FE) MG tablet Take 325 mg by mouth 2 (two) times daily.   furosemide (LASIX) 20 MG tablet Take 20 mg by mouth. Take one by mouth on Monday, Wednesday, and Friday.   HYDROcodone-acetaminophen (NORCO/VICODIN) 5-325 MG tablet Take 1 tablet by mouth every 12 (twelve) hours as needed. For Pain   Multiple Vitamin (MULTIVITAMIN WITH MINERALS) TABS tablet Take 1 tablet by mouth daily.   NON FORMULARY Take 1 each by mouth 2 (two) times daily. Magic Cup   Nutritional Supplements (ENSURE PO) Take 237 mLs by mouth 2 (two) times daily.   Vitamin D, Ergocalciferol, (DRISDOL) 1.25 MG (50000 UNIT) CAPS capsule Take 1 capsule (50,000 Units total) by mouth every 7 (seven) days.   [DISCONTINUED] OXYGEN Inhale 2 L into the lungs continuous.   No facility-administered encounter medications on file as of 02/14/2021.    Review of Systems  Unable to obtain due to dementia.   There is no immunization history on file for this patient. Pertinent  Health Maintenance Due  Topic Date Due   DEXA SCAN  Never done   PNA vac Low Risk  Adult (1 of 2 - PCV13) Never done   INFLUENZA VACCINE  04/07/2021   No flowsheet data found.   Vitals:   02/14/21 1035  BP: 138/78  Pulse: 92  Resp: 18  Temp: (!) 97.3 F (36.3 C)  Weight: 121 lb 9.6 oz (55.2 kg)  Height: 5\' 1"  (1.549 m)   Body mass index is 22.98 kg/m.  Physical Exam  GENERAL APPEARANCE: Well nourished. In no acute distress. Normal body habitus MOUTH and THROAT: Lips are without lesions. Oral mucosa is moist and without lesions.  RESPIRATORY:  Breathing is even & unlabored, O2 @ 2L/min via [place] CARDIAC: RRR, no murmur,no extra heart sounds, no edema GI: Abdomen soft, normal BS, no masses, no tenderness NEUROLOGICAL: There is no tremor. Speech is clear. Alert to self, disoriented to time and place. PSYCHIATRIC:  Affect and behavior are appropriate  Labs reviewed: Recent Labs    07/04/20 0943 07/05/20 0451 07/06/20 0333 08/06/20 0000 08/09/20 0000 08/12/20 0000 10/31/20 0000  NA 136 135 136   < > 133* 136* 139  K 4.5 4.7 4.4   < > 4.4 5.1 3.7  CL 103 104 102   < > 96* 99 100  CO2 21* 26 27   < > 23* 21 26*  GLUCOSE 151* 140* 129*  --   --   --   --   BUN 14 24* 22   < > 62* 34* 26*  CREATININE 1.18* 1.15* 1.12*   < > 1.8* 1.0 0.9  CALCIUM 8.6* 8.5* 8.7*   < > 8.6* 8.7 9.0   < > = values in this interval not displayed.   No results for input(s): AST, ALT, ALKPHOS, BILITOT, PROT, ALBUMIN in the last 8760 hours. Recent Labs    07/03/20 1159 07/04/20 0943 07/05/20 0451 08/06/20 0000 08/12/20 0000 08/20/20 0000 08/27/20 0000 11/12/20 0000 11/15/20 0000  WBC 8.7 13.1* 11.6*   < > 11.4 10.4 7.1 7.7 7.5  NEUTROABS 7.1  --   --    < > 8.50 1.30 5.00  --   --   HGB 12.7 11.2* 9.6*   < > 8.3* 8.8* 9.2* 10.0* 9.2*  HCT 42.2 36.2 31.0*   < > 25* 27* 28* 31* 28*  MCV 90.9 88.5 89.6  --   --   --   --   --   --   PLT 234 255 181   < > 257 224 209 249 235   < > = values in this interval not displayed.   Lab Results  Component Value Date   TSH 2.27 07/10/2020    Assessment/Plan  1. Poor appetite -  will start Remeron 15 mg PO Q HS  2. Weight loss -  will continue supplementation with Ensure, multivitamins with minerals and Magic cup  3. 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  Labs/tests ordered:  None  Goals of care:   Long-term care   13/11/2019, DNP, MSN, FNP-BC [facility] and Adult Medicine [phone] (Monday-Friday 8:00 a.m. - 5:00 p.m.) [phone] (after hours)

## 2021-02-19 ENCOUNTER — Encounter: Payer: Self-pay | Admitting: Adult Health

## 2021-02-19 ENCOUNTER — Non-Acute Institutional Stay (SKILLED_NURSING_FACILITY): Payer: Medicare Other | Admitting: Adult Health

## 2021-02-19 DIAGNOSIS — Z7189 Other specified counseling: Secondary | ICD-10-CM

## 2021-02-19 DIAGNOSIS — R630 Anorexia: Secondary | ICD-10-CM

## 2021-02-19 DIAGNOSIS — L89613 Pressure ulcer of right heel, stage 3: Secondary | ICD-10-CM | POA: Diagnosis not present

## 2021-02-19 DIAGNOSIS — R1312 Dysphagia, oropharyngeal phase: Secondary | ICD-10-CM | POA: Diagnosis not present

## 2021-02-19 NOTE — Progress Notes (Signed)
Location:  [facility] Room Number: 127-A Place of Service:  SNF (31) Provider:  Kenard Gower, DNP, FNP-BC  Patient Care Team: Pecola Lawless, MD as PCP - General (Internal Medicine) Rehab, [facility] Facility)  Extended Emergency Contact Information Primary Emergency Contact: Daleen Bo [place] Home Phone: [phone] Relation: Son Secondary Emergency Contact: Beverley Fiedler [place] Home Phone: [phone] Relation: Son  Code Status:  DNR  Goals of care: Advanced Directive information Advanced Directives 02/19/2021  Does Patient Have a Medical Advance Directive? Yes  Type of Advance Directive Healthcare Power of Attorney  Does patient want to make changes to medical advance directive? No - Patient declined  Copy of Healthcare Power of Attorney in Chart? Yes - validated most recent copy scanned in chart (See row information)  Would patient like information on creating a medical advance directive? -  Pre-existing out of facility DNR order (yellow form or pink MOST form) Yellow form placed in chart (order not valid for inpatient use)     Chief Complaint  Patient presents with   Follow-up    Care plan meeting     HPI:  Pt is a 87 y.o. female who had a care plan meeting today. She is a long-term care resident of [facility]. The meeting was attended by social worker, [employer], Human resources officer, treatment nurse and NP. Son, Retia Cordle, was called via telephone conference but did not answer. She remains to be DNR. She is being followed by speech therapy for dysphagia. She has diet of puree with thin liquids. She was recently started on Remeron for poor appetite. Right heel pressure ulcer is healing stage III. Discussed medications and vital signs.  She attends art activities. The meeting lasted for 20 minutes.    Past Medical History:  Diagnosis Date    Memory impairment    Vitamin D deficiency    Past Surgical History:  Procedure Laterality Date   INTRAMEDULLARY (IM) NAIL INTERTROCHANTERIC Left 07/03/2020   Procedure: INTRAMEDULLARY (IM) NAIL INTERTROCHANTRIC;  Surgeon: Roby Lofts, MD;  Location: [facility] OR;  Service: Orthopedics;  Laterality: Left;   ORIF FEMUR FRACTURE Left 07/03/2020    No Known Allergies  Outpatient Encounter Medications as of 02/19/2021  Medication Sig   acetaminophen (TYLENOL) 500 MG tablet Take 500 mg by mouth every 4 (four) hours as needed. For Pain   ascorbic acid (VITAMIN C) 500 MG tablet Take 500 mg by mouth 2 (two) times daily.   docusate sodium (COLACE) 100 MG capsule Take 1 capsule (100 mg total) by mouth 2 (two) times daily.   ferrous sulfate 325 (65 FE) MG tablet Take 325 mg by mouth 2 (two) times daily.   furosemide (LASIX) 20 MG tablet Take 20 mg by mouth. Take one by mouth on Monday, Wednesday, and Friday.   HYDROcodone-acetaminophen (NORCO/VICODIN) 5-325 MG tablet Take 1 tablet by mouth every 12 (twelve) hours as needed. For Pain   mirtazapine (REMERON) 15 MG tablet Take 15 mg by mouth at bedtime. For poor appetite   Multiple Vitamin (MULTIVITAMIN WITH MINERALS) TABS tablet Take 1 tablet by mouth daily.   NON FORMULARY Take 1 each by mouth 2 (two) times daily. Magic Cup   Nutritional Supplements (ENSURE PO) Take 237 mLs by mouth 2 (two) times daily.   OXYGEN Inhale 2 L into the lungs continuous.   Vitamin D, Ergocalciferol, (DRISDOL) 1.25 MG (50000 UNIT) CAPS capsule Take 1 capsule (  50,000 Units total) by mouth every 7 (seven) days.   No facility-administered encounter medications on file as of 02/19/2021.    Review of Systems  Unable to obtain due to cognitive impairment.    There is no immunization history on file for this patient. Pertinent  Health Maintenance Due  Topic Date Due   DEXA SCAN  Never done   PNA vac Low Risk Adult (1 of 2 - PCV13) Never done   INFLUENZA VACCINE  04/07/2021    No flowsheet data found.   Vitals:   02/19/21 0840  BP: 110/60  Pulse: 90  Resp: 18  Temp: 98.6 F (37 C)  Weight: 121 lb (54.9 kg)  Height: 5\' 1"  (1.549 m)   Body mass index is 22.86 kg/m.  Physical Exam  GENERAL APPEARANCE: Well nourished. In no acute distress. Normal body habitus SKIN:  Right heel pressure ulcer stage III MOUTH and THROAT: Lips are without lesions. Oral mucosa is moist and without lesions.  RESPIRATORY: Breathing is even & unlabored, BS CTAB CARDIAC: RRR, no murmur,no extra heart sounds, no edema GI: Abdomen soft, normal BS, no masses, no tenderness NEUROLOGICAL: There is no tremor. Alert to self, disoriented to time and place. PSYCHIATRIC:  Affect and behavior are appropriate  Labs reviewed: Recent Labs    07/04/20 0943 07/05/20 0451 07/06/20 0333 08/06/20 0000 08/09/20 0000 08/12/20 0000 10/31/20 0000  NA 136 135 136   < > 133* 136* 139  K 4.5 4.7 4.4   < > 4.4 5.1 3.7  CL 103 104 102   < > 96* 99 100  CO2 21* 26 27   < > 23* 21 26*  GLUCOSE 151* 140* 129*  --   --   --   --   BUN 14 24* 22   < > 62* 34* 26*  CREATININE 1.18* 1.15* 1.12*   < > 1.8* 1.0 0.9  CALCIUM 8.6* 8.5* 8.7*   < > 8.6* 8.7 9.0   < > = values in this interval not displayed.   No results for input(s): AST, ALT, ALKPHOS, BILITOT, PROT, ALBUMIN in the last 8760 hours. Recent Labs    07/03/20 1159 07/04/20 0943 07/05/20 0451 08/06/20 0000 08/12/20 0000 08/20/20 0000 08/27/20 0000 11/12/20 0000 11/15/20 0000  WBC 8.7 13.1* 11.6*   < > 11.4 10.4 7.1 7.7 7.5  NEUTROABS 7.1  --   --    < > 8.50 1.30 5.00  --   --   HGB 12.7 11.2* 9.6*   < > 8.3* 8.8* 9.2* 10.0* 9.2*  HCT 42.2 36.2 31.0*   < > 25* 27* 28* 31* 28*  MCV 90.9 88.5 89.6  --   --   --   --   --   --   PLT 234 255 181   < > 257 224 209 249 235   < > = values in this interval not displayed.   Lab Results  Component Value Date   TSH 2.27 07/10/2020   No results found for: HGBA1C No results found  for: CHOL, HDL, LDLCALC, LDLDIRECT, TRIG, CHOLHDL  Significant Diagnostic Results in last 30 days:  No results found.  Assessment/Plan  1. Advance care planning -    Remains to be DNR -    discussed medications, vital signs and weights  2. Oropharyngeal dysphagia -    followed by speech therapy -    aspiration precautions  3. Poor appetite -  recently started on Remeron -  continue supplementation with multivitamin with minerals, Ensure and magic cup  4. 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   -    Followed by in-house wound consultant     Family/ staff Communication:   Discussed plan of care with IDT.  Labs/tests ordered:  None  Goals of care:   Long-term care   Kenard Gower, DNP, MSN, FNP-BC [facility] and Adult Medicine [phone] (Monday-Friday 8:00 a.m. - 5:00 p.m.) [phone] (after hours)

## 2021-03-14 ENCOUNTER — Encounter: Payer: Self-pay | Admitting: Adult Health

## 2021-03-14 ENCOUNTER — Non-Acute Institutional Stay (SKILLED_NURSING_FACILITY): Payer: Medicare Other | Admitting: Adult Health

## 2021-03-14 DIAGNOSIS — F0390 Unspecified dementia without behavioral disturbance: Secondary | ICD-10-CM

## 2021-03-14 DIAGNOSIS — R400 Somnolence: Secondary | ICD-10-CM

## 2021-03-14 DIAGNOSIS — R634 Abnormal weight loss: Secondary | ICD-10-CM | POA: Diagnosis not present

## 2021-03-14 DIAGNOSIS — R630 Anorexia: Secondary | ICD-10-CM

## 2021-03-14 NOTE — Progress Notes (Signed)
Location:  [facility] Room Number: 127-A Place of Service:  SNF (31) Provider:  Kenard Gower, DNP, FNP-BC  Patient Care Team: Pecola Lawless, MD as PCP - General (Internal Medicine) Rehab, [facility]  Extended Emergency Contact Information Primary Emergency Contact: Daleen Bo [place] of [place] Home Phone: [phone] Relation: Son Secondary Emergency Contact: Beverley Fiedler [place] Home Phone: [phone] Relation: Son  Code Status: DNR   Goals of care: Advanced Directive information Advanced Directives 03/14/2021  Does Patient Have a Medical Advance Directive? Yes  Type of Estate agent of [place];Out of facility DNR (pink MOST or yellow form)  Does patient want to make changes to medical advance directive? No - Patient declined  Copy of Healthcare Power of Attorney in Chart? Yes - validated most recent copy scanned in chart (See row information)  Would patient like information on creating a medical advance directive? -  Pre-existing out of facility DNR order (yellow form or pink MOST form) -     Chief Complaint  Patient presents with   Acute Visit    Change of condition.     HPI:  Pt is a 87 y.o. female seen today for change of condition. Staff reported that she is noted to be more sleepy, decreased appetite and pocketing food. She was seen sleeping while sitting on the wheelchair. She woke up to verbal greetings. Right heel wound has healed. Son is aware of the change of condition and will discuss with family regarding options such as feeding tube or comfort care. Latest weight is 111.4 lbs, down 8.39% in 31 days, down 12.56% in 83 days and down 17.60 % in 181 days. She is a long-term care resident of [facility].    Past Medical History:  Diagnosis Date   Memory impairment    Vitamin D deficiency    Past Surgical History:   Procedure Laterality Date   INTRAMEDULLARY (IM) NAIL INTERTROCHANTERIC Left 07/03/2020   Procedure: INTRAMEDULLARY (IM) NAIL INTERTROCHANTRIC;  Surgeon: Roby Lofts, MD;  Location: [facility] OR;  Service: Orthopedics;  Laterality: Left;   ORIF FEMUR FRACTURE Left 07/03/2020    No Known Allergies  Outpatient Encounter Medications as of 03/14/2021  Medication Sig   acetaminophen (TYLENOL) 500 MG tablet Take 500 mg by mouth every 4 (four) hours as needed. For Pain   ascorbic acid (VITAMIN C) 500 MG tablet Take 500 mg by mouth 2 (two) times daily.   docusate sodium (COLACE) 100 MG capsule Take 1 capsule (100 mg total) by mouth 2 (two) times daily.   ferrous sulfate 325 (65 FE) MG tablet Take 325 mg by mouth 2 (two) times daily.   furosemide (LASIX) 20 MG tablet Take 20 mg by mouth. Take one by mouth on Monday, Wednesday, and Friday.   HYDROcodone-acetaminophen (NORCO/VICODIN) 5-325 MG tablet Take 1 tablet by mouth every 12 (twelve) hours as needed. For Pain   mirtazapine (REMERON) 15 MG tablet Take 15 mg by mouth at bedtime. For poor appetite   Multiple Vitamin (MULTIVITAMIN WITH MINERALS) TABS tablet Take 1 tablet by mouth daily.   NON FORMULARY Take 1 each by mouth 2 (two) times daily. Magic Cup   Nutritional Supplements (ENSURE PO) Take 237 mLs by mouth 2 (two) times daily.   OXYGEN Inhale 2 L into the lungs continuous.   Vitamin D, Ergocalciferol, (DRISDOL) 1.25 MG (50000 UNIT) CAPS capsule Take 1 capsule (50,000 Units total) by mouth  every 7 (seven) days.   No facility-administered encounter medications on file as of 03/14/2021.    Review of Systems  Unable to obtain due to dementia.    There is no immunization history on file for this patient. Pertinent  Health Maintenance Due  Topic Date Due   DEXA SCAN  Never done   PNA vac Low Risk Adult (1 of 2 - PCV13) Never done   INFLUENZA VACCINE  04/07/2021   No flowsheet data found.   Vitals:   03/14/21 1644  BP: 111/69  Pulse: 100   Resp: 20  Temp: (!) 96.3 F (35.7 C)  Weight: 111 lb 6.4 oz (50.5 kg)   Body mass index is 21.05 kg/m.  Physical Exam  GENERAL APPEARANCE:  In no acute distress. Normal body habitus SKIN:  Skin is warm and dry.  MOUTH and THROAT: Lips are without lesions. Oral mucosa is moist and without lesions.  RESPIRATORY: Breathing is even & unlabored, BS CTAB CARDIAC: RRR, no murmur,no extra heart sounds, no edema GI: Abdomen soft, normal BS, no masses, no tenderness NEUROLOGICAL: There is no tremor. Speech is clear. Alert to self, disoriented to time and place. PSYCHIATRIC:  Affect and behavior are appropriate  Labs reviewed: Recent Labs    07/04/20 0943 07/05/20 0451 07/06/20 0333 08/06/20 0000 08/09/20 0000 08/12/20 0000 10/31/20 0000  NA 136 135 136   < > 133* 136* 139  K 4.5 4.7 4.4   < > 4.4 5.1 3.7  CL 103 104 102   < > 96* 99 100  CO2 21* 26 27   < > 23* 21 26*  GLUCOSE 151* 140* 129*  --   --   --   --   BUN 14 24* 22   < > 62* 34* 26*  CREATININE 1.18* 1.15* 1.12*   < > 1.8* 1.0 0.9  CALCIUM 8.6* 8.5* 8.7*   < > 8.6* 8.7 9.0   < > = values in this interval not displayed.   No results for input(s): AST, ALT, ALKPHOS, BILITOT, PROT, ALBUMIN in the last 8760 hours. Recent Labs    07/03/20 1159 07/04/20 0943 07/05/20 0451 08/06/20 0000 08/12/20 0000 08/20/20 0000 08/27/20 0000 11/12/20 0000 11/15/20 0000  WBC 8.7 13.1* 11.6*   < > 11.4 10.4 7.1 7.7 7.5  NEUTROABS 7.1  --   --    < > 8.50 1.30 5.00  --   --   HGB 12.7 11.2* 9.6*   < > 8.3* 8.8* 9.2* 10.0* 9.2*  HCT 42.2 36.2 31.0*   < > 25* 27* 28* 31* 28*  MCV 90.9 88.5 89.6  --   --   --   --   --   --   PLT 234 255 181   < > 257 224 209 249 235   < > = values in this interval not displayed.   Lab Results  Component Value Date   TSH 2.27 07/10/2020    Assessment/Plan  1. Somnolence -  will continue supportive care  2. Weight loss -  she is pocketing food and not swallowing her food -  will continue  supplementation with ensure, magic cup and multivitamins with minerals  3. Poor appetite -   will continue Remeron to increase appetite  4. Dementia without behavioral disturbance, unspecified dementia type (HCC) -   BIMS score 3/15, ranging in severe cognitive impairment -  will continue supportive care     Family/ staff Communication:   Discussed  plan of care with charge nurse.  Labs/tests ordered:  CBC and BMP  Goals of care:   Long-term care   Kenard Gower, DNP, MSN, FNP-BC [facility] [phone] (Monday-Friday 8:00 a.m. - 5:00 p.m.) [phone] (after hours)

## 2021-03-20 DIAGNOSIS — Z9981 Dependence on supplemental oxygen: Secondary | ICD-10-CM

## 2021-03-20 DIAGNOSIS — G301 Alzheimer's disease with late onset: Secondary | ICD-10-CM

## 2021-03-20 DIAGNOSIS — D520 Dietary folate deficiency anemia: Secondary | ICD-10-CM

## 2021-03-20 DIAGNOSIS — M40292 Other kyphosis, cervical region: Secondary | ICD-10-CM | POA: Diagnosis not present

## 2021-03-20 DIAGNOSIS — E559 Vitamin D deficiency, unspecified: Secondary | ICD-10-CM | POA: Diagnosis not present

## 2021-03-20 DIAGNOSIS — F0390 Unspecified dementia without behavioral disturbance: Secondary | ICD-10-CM

## 2021-03-20 DIAGNOSIS — A310 Pulmonary mycobacterial infection: Secondary | ICD-10-CM

## 2021-04-07 DEATH — deceased
